# Patient Record
Sex: Female | Born: 1970 | Race: White | Hispanic: No | Marital: Single | Smoking: Current some day smoker
Health system: Southern US, Community
[De-identification: ages and names within clinical notes are randomized; demographics above are authoritative.]

## PROBLEM LIST (undated history)

## (undated) DIAGNOSIS — G40909 Epilepsy, unspecified, not intractable, without status epilepticus: Secondary | ICD-10-CM

## (undated) DIAGNOSIS — G629 Polyneuropathy, unspecified: Secondary | ICD-10-CM

## (undated) DIAGNOSIS — F329 Major depressive disorder, single episode, unspecified: Secondary | ICD-10-CM

## (undated) DIAGNOSIS — F32A Depression, unspecified: Secondary | ICD-10-CM

## (undated) DIAGNOSIS — F419 Anxiety disorder, unspecified: Secondary | ICD-10-CM

## (undated) DIAGNOSIS — S0990XA Unspecified injury of head, initial encounter: Secondary | ICD-10-CM

## (undated) DIAGNOSIS — K219 Gastro-esophageal reflux disease without esophagitis: Secondary | ICD-10-CM

## (undated) DIAGNOSIS — F70 Mild intellectual disabilities: Secondary | ICD-10-CM

## (undated) DIAGNOSIS — F609 Personality disorder, unspecified: Secondary | ICD-10-CM

## (undated) HISTORY — PX: TUBAL LIGATION: SHX77

## (undated) HISTORY — PX: OTHER SURGICAL HISTORY: SHX169

---

## 1992-04-28 HISTORY — PX: OTHER SURGICAL HISTORY: SHX169

## 1993-02-26 HISTORY — PX: TRACHEOSTOMY: SUR1362

## 2006-02-12 ENCOUNTER — Encounter: Admission: RE | Admit: 2006-02-12 | Discharge: 2006-02-12 | Payer: Self-pay | Admitting: Nurse Practitioner

## 2007-01-29 ENCOUNTER — Encounter: Admission: RE | Admit: 2007-01-29 | Discharge: 2007-03-16 | Payer: Self-pay | Admitting: Internal Medicine

## 2007-12-17 ENCOUNTER — Emergency Department (HOSPITAL_COMMUNITY): Admission: EM | Admit: 2007-12-17 | Discharge: 2007-12-17 | Payer: Self-pay | Admitting: Emergency Medicine

## 2008-06-04 ENCOUNTER — Emergency Department (HOSPITAL_COMMUNITY): Admission: EM | Admit: 2008-06-04 | Discharge: 2008-06-04 | Payer: Self-pay | Admitting: Emergency Medicine

## 2009-03-28 ENCOUNTER — Encounter: Admission: RE | Admit: 2009-03-28 | Discharge: 2009-05-10 | Payer: Self-pay | Admitting: Internal Medicine

## 2009-05-09 ENCOUNTER — Encounter: Admission: RE | Admit: 2009-05-09 | Discharge: 2009-05-31 | Payer: Self-pay | Admitting: Internal Medicine

## 2009-12-23 ENCOUNTER — Emergency Department (HOSPITAL_COMMUNITY): Admission: EM | Admit: 2009-12-23 | Discharge: 2009-12-23 | Payer: Self-pay | Admitting: Family Medicine

## 2010-05-27 ENCOUNTER — Emergency Department (HOSPITAL_COMMUNITY)
Admission: EM | Admit: 2010-05-27 | Discharge: 2010-05-27 | Payer: Self-pay | Source: Home / Self Care | Admitting: Emergency Medicine

## 2011-09-01 ENCOUNTER — Encounter (HOSPITAL_COMMUNITY): Payer: Self-pay | Admitting: Gastroenterology

## 2011-09-02 ENCOUNTER — Encounter (HOSPITAL_COMMUNITY): Payer: Self-pay | Admitting: *Deleted

## 2011-09-02 ENCOUNTER — Ambulatory Visit (HOSPITAL_COMMUNITY): Payer: Medicare Other | Admitting: Anesthesiology

## 2011-09-02 ENCOUNTER — Encounter (HOSPITAL_COMMUNITY): Payer: Self-pay | Admitting: Anesthesiology

## 2011-09-02 ENCOUNTER — Ambulatory Visit (HOSPITAL_COMMUNITY)
Admission: RE | Admit: 2011-09-02 | Discharge: 2011-09-02 | Disposition: A | Discharge status: Home / Self Care | Payer: Medicare Other | Source: Ambulatory Visit | Attending: Gastroenterology | Admitting: Gastroenterology

## 2011-09-02 ENCOUNTER — Encounter (HOSPITAL_COMMUNITY)
Admission: RE | Disposition: A | Discharge status: Home / Self Care | Payer: Self-pay | Source: Ambulatory Visit | Attending: Gastroenterology

## 2011-09-02 DIAGNOSIS — R198 Other specified symptoms and signs involving the digestive system and abdomen: Secondary | ICD-10-CM | POA: Insufficient documentation

## 2011-09-02 DIAGNOSIS — K648 Other hemorrhoids: Secondary | ICD-10-CM | POA: Insufficient documentation

## 2011-09-02 DIAGNOSIS — D126 Benign neoplasm of colon, unspecified: Secondary | ICD-10-CM | POA: Insufficient documentation

## 2011-09-02 HISTORY — DX: Anxiety disorder, unspecified: F41.9

## 2011-09-02 HISTORY — DX: Polyneuropathy, unspecified: G62.9

## 2011-09-02 HISTORY — DX: Mild intellectual disabilities: F70

## 2011-09-02 HISTORY — DX: Epilepsy, unspecified, not intractable, without status epilepticus: G40.909

## 2011-09-02 HISTORY — DX: Gastro-esophageal reflux disease without esophagitis: K21.9

## 2011-09-02 HISTORY — DX: Unspecified injury of head, initial encounter: S09.90XA

## 2011-09-02 HISTORY — DX: Major depressive disorder, single episode, unspecified: F32.9

## 2011-09-02 HISTORY — DX: Depression, unspecified: F32.A

## 2011-09-02 HISTORY — DX: Personality disorder, unspecified: F60.9

## 2011-09-02 SURGERY — COLONOSCOPY WITH PROPOFOL
Anesthesia: Monitor Anesthesia Care

## 2011-09-02 MED ORDER — LIDOCAINE HCL (CARDIAC) 20 MG/ML IV SOLN
INTRAVENOUS | Status: DC | PRN
  Administered 2011-09-02: 50 mg via INTRAVENOUS

## 2011-09-02 MED ORDER — ONDANSETRON HCL 4 MG/2ML IJ SOLN
INTRAMUSCULAR | Status: DC | PRN
  Administered 2011-09-02: 4 mg via INTRAVENOUS

## 2011-09-02 MED ORDER — FENTANYL CITRATE 0.05 MG/ML IJ SOLN: 25.0000 ug | INTRAMUSCULAR | Status: DC | PRN

## 2011-09-02 MED ORDER — GLYCOPYRROLATE 0.2 MG/ML IJ SOLN
INTRAMUSCULAR | Status: DC | PRN
  Administered 2011-09-02 (×3): .05 mg via INTRAVENOUS

## 2011-09-02 MED ORDER — KETAMINE HCL 50 MG/ML IJ SOLN
INTRAMUSCULAR | Status: DC | PRN
  Administered 2011-09-02 (×2): 10 mg via INTRAMUSCULAR
  Administered 2011-09-02 (×2): 5 mg via INTRAMUSCULAR

## 2011-09-02 MED ORDER — FENTANYL CITRATE 0.05 MG/ML IJ SOLN
INTRAMUSCULAR | Status: DC | PRN
  Administered 2011-09-02: 50 ug via INTRAVENOUS
  Administered 2011-09-02: 25 ug via INTRAVENOUS

## 2011-09-02 MED ORDER — LACTATED RINGERS IV SOLN
INTRAVENOUS | Status: DC | PRN
  Administered 2011-09-02: 09:00:00 via INTRAVENOUS

## 2011-09-02 MED ORDER — LACTATED RINGERS IV SOLN
INTRAVENOUS | Status: DC
  Administered 2011-09-02: 1000 mL via INTRAVENOUS

## 2011-09-02 MED ORDER — PROPOFOL 10 MG/ML IV EMUL
INTRAVENOUS | Status: DC | PRN
  Administered 2011-09-02: 200 ug/kg/min via INTRAVENOUS

## 2011-09-02 SURGICAL SUPPLY — 21 items

## 2011-09-02 NOTE — H&P (Signed)
  Date of Initial H&P: 08/07/11  History reviewed, patient examined, no change in status, stable for surgery.

## 2011-09-02 NOTE — Discharge Instructions (Addendum)
Do not take aspirin products for the next 7 days. Will call you when polyp results are available.  Colonoscopy Care After Read the instructions outlined below and refer to this sheet in the next few weeks. These discharge instructions provide you with general information on caring for yourself after you leave the hospital. Your doctor may also give you specific instructions. While your treatment has been planned according to the most current medical practices available, unavoidable complications occasionally occur. If you have any problems or questions after discharge, call your doctor. HOME CARE INSTRUCTIONS ACTIVITY:  You may resume your regular activity, but move at a slower pace for the next 24 hours.   Take frequent rest periods for the next 24 hours.   Walking will help get rid of the air and reduce the bloated feeling in your belly (abdomen).   No driving for 24 hours (because of the medicine (anesthesia) used during the test).   You may shower.   Do not sign any important legal documents or operate any machinery for 24 hours (because of the anesthesia used during the test).  NUTRITION:  Drink plenty of fluids.   You may resume your normal diet as instructed by your doctor.   Begin with a light meal and progress to your normal diet. Heavy or fried foods are harder to digest and may make you feel sick to your stomach (nauseated).   Avoid alcoholic beverages for 24 hours or as instructed.  MEDICATIONS:  You may resume your normal medications unless your doctor tells you otherwise.  WHAT TO EXPECT TODAY:  Some feelings of bloating in the abdomen.   Passage of more gas than usual.   Spotting of blood in your stool or on the toilet paper.  IF YOU HAD POLYPS REMOVED DURING THE COLONOSCOPY:  No aspirin products for 7 days or as instructed.   No alcohol for 7 days or as instructed.   Eat a soft diet for the next 24 hours.  FINDING OUT THE RESULTS OF YOUR TEST Not all test  results are available during your visit. If your test results are not back during the visit, make an appointment with your caregiver to find out the results. Do not assume everything is normal if you have not heard from your caregiver or the medical facility. It is important for you to follow up on all of your test results.  SEEK IMMEDIATE MEDICAL CARE IF:  You have more than a spotting of blood in your stool.   Your belly is swollen (abdominal distention).   You are nauseated or vomiting.   You have a fever.   You have abdominal pain or discomfort that is severe or gets worse throughout the day.    Moderate Sedation, Adult Moderate sedation is given to help you relax or even sleep through a procedure. You may remain sleepy, be clumsy, or have poor balance for several hours following this procedure. Arrange for a responsible adult, family member, or friend to take you home. A responsible adult should stay with you for at least 24 hours or until the medicines have worn off.  Do not participate in any activities where you could become injured for the next 24 hours, or until you feel normal again. Do not:   Drive.   Swim.   Ride a bicycle.   Operate heavy machinery.   Cook.   Use power tools.   Climb ladders.   Work at [employer].   Do not make important decisions  or sign legal documents until you are improved.   Vomiting may occur if you eat too soon. When you can drink without vomiting, try water, juice, or soup. Try solid foods if you feel little or no nausea.   Only take over-the-counter or prescription medications for pain, discomfort, or fever as directed by your caregiver.If pain medications have been prescribed for you, ask your caregiver how soon it is safe to take them.   Make sure you and your family fully understands everything about the medication given to you. Make sure you understand what side effects may occur.   You should not drink alcohol, take sleeping pills,  or medications that cause drowsiness for at least 24 hours.   If you smoke, do not smoke alone.   If you are feeling better, you may resume normal activities 24 hours after receiving sedation.   Keep all appointments as scheduled. Follow all instructions.   Ask questions if you do not understand.  SEEK MEDICAL CARE IF:   Your skin is pale or bluish in color.   You continue to feel sick to your stomach (nauseous) or throw up (vomit).   Your pain is getting worse and not helped by medication.   You have bleeding or swelling.   You are still sleepy or feeling clumsy after 24 hours.  SEEK IMMEDIATE MEDICAL CARE IF:   You develop a rash.   You have difficulty breathing.   You develop any type of allergic problem.   You have a fever.  Document Released: 01/07/2001 Document Revised: 04/03/2011 Document Reviewed: 05/31/2007 [facility] Patient Information 2012 [place], [place].

## 2011-09-02 NOTE — Preoperative (Signed)
Beta Blockers   Reason not to administer Beta Blockers:Not Applicable 

## 2011-09-02 NOTE — Op Note (Signed)
[facility] [address] [place], [place]  [address]  COLONOSCOPY PROCEDURE REPORT  PATIENT:  Angela Combs, Angela Combs  MR#:  [id number] BIRTHDATE:  08/26/1970, 41 yrs. old  GENDER:  female ENDOSCOPIST:  Charlott Rakes, MD REF. BY:  Fleet Contras, M.D. PROCEDURE DATE:  09/02/2011 PROCEDURE:  Colonoscopy with snare polypectomy ASA CLASS:  Class III INDICATIONS:  change in bowel habits MEDICATIONS:   See Anesthesia Report.  DESCRIPTION OF PROCEDURE:   After the risks benefits and alternatives of the procedure were thoroughly explained, informed consent was obtained.  The Pentax Ped Colon [id number] endoscope was introduced through the anus and advanced to the cecum, which was identified by both the appendix and ileocecal valve, without limitations.  The quality of the prep was good. The instrument was then slowly withdrawn as the colon was fully examined. <<PROCEDUREIMAGES>>  FINDINGS:  Rectal exam unremarkable.  Pediatric colonoscope inserted into the colon and advanced to the cecum, where the appendiceal orifice and ileocecal valve were identified.    On careful withdrawal of the colonoscope a 7 mm semi-sessile polyp was seen in the sigmoid colon and was removed with snare cautery. Retroflexion revealed small internal hemorrhoids.  COMPLICATIONS:  None  IMPRESSION:     1. Colon polyp - status post snare cautery 2. Small internal hemorrhoids  RECOMMENDATIONS:  1. F/U on path 2. High fiber diet 3. Hold aspirin products for 1 week  ______________________________ Charlott Rakes, MD  CC:  Fleet Contras, MD  n. Rosalie DoctorCharlott Rakes at 09/02/2011 10:16 AM  Azzie Roup, [phone]

## 2011-09-02 NOTE — Transfer of Care (Signed)
Immediate Anesthesia Transfer of Care Note  Patient: Angela Combs  Procedure(s) Performed: Procedure(s) (LRB): COLONOSCOPY WITH PROPOFOL (N/A)  Patient Location: PACU and Endoscopy Unit  Anesthesia Type: MAC  Level of Consciousness: awake, alert , oriented, patient cooperative and responds to stimulation  Airway & Oxygen Therapy: Patient Spontanous Breathing and Patient connected to nasal cannula oxygen  Post-op Assessment: Report given to PACU RN, Post -op Vital signs reviewed and stable and Patient moving all extremities  Post vital signs: Reviewed and stable  Complications: No apparent anesthesia complications

## 2011-09-02 NOTE — Interval H&P Note (Signed)
History and Physical Interval Note:  09/02/2011 9:33 AM  Angela Combs  has presented today for surgery, with the diagnosis of constipation/change in bowel  The various methods of treatment have been discussed with the patient and family. After consideration of risks, benefits and other options for treatment, the patient has consented to  Procedure(s) (LRB): COLONOSCOPY WITH PROPOFOL (N/A) as a surgical intervention .  The patients' history has been reviewed, patient examined, no change in status, stable for surgery.  I have reviewed the patients' chart and labs.  Questions were answered to the patient's satisfaction.     [name] C.

## 2011-09-02 NOTE — Brief Op Note (Signed)
See endopro note 

## 2011-09-02 NOTE — Anesthesia Postprocedure Evaluation (Signed)
  Anesthesia Post-op Note  Patient: Angela Combs  Procedure(s) Performed: Procedure(s) (LRB): COLONOSCOPY WITH PROPOFOL (N/A)  Patient Location: PACU  Anesthesia Type: MAC  Level of Consciousness: awake and alert   Airway and Oxygen Therapy: Patient Spontanous Breathing  Post-op Pain: mild  Post-op Assessment: Post-op Vital signs reviewed, Patient's Cardiovascular Status Stable, Respiratory Function Stable, Patent Airway and No signs of Nausea or vomiting  Post-op Vital Signs: stable  Complications: No apparent anesthesia complications
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
## 2012-12-08 ENCOUNTER — Encounter: Payer: Self-pay | Admitting: Advanced Practice Midwife

## 2013-01-11 ENCOUNTER — Ambulatory Visit: Payer: Self-pay | Admitting: Advanced Practice Midwife

## 2013-05-09 ENCOUNTER — Other Ambulatory Visit: Payer: Self-pay

## 2013-05-09 DIAGNOSIS — Z1231 Encounter for screening mammogram for malignant neoplasm of breast: Secondary | ICD-10-CM

## 2013-05-27 ENCOUNTER — Ambulatory Visit
Admission: RE | Admit: 2013-05-27 | Discharge: 2013-05-27 | Disposition: A | Discharge status: Home / Self Care | Payer: Medicare Other | Source: Ambulatory Visit

## 2013-05-27 DIAGNOSIS — Z1231 Encounter for screening mammogram for malignant neoplasm of breast: Secondary | ICD-10-CM

## 2014-01-09 ENCOUNTER — Ambulatory Visit: Payer: Medicare Other | Attending: Internal Medicine

## 2014-01-09 DIAGNOSIS — IMO0001 Reserved for inherently not codable concepts without codable children: Secondary | ICD-10-CM | POA: Insufficient documentation

## 2014-01-09 DIAGNOSIS — R279 Unspecified lack of coordination: Secondary | ICD-10-CM | POA: Insufficient documentation

## 2014-01-09 DIAGNOSIS — R262 Difficulty in walking, not elsewhere classified: Secondary | ICD-10-CM | POA: Insufficient documentation

## 2014-01-09 DIAGNOSIS — M25669 Stiffness of unspecified knee, not elsewhere classified: Secondary | ICD-10-CM | POA: Diagnosis not present

## 2014-01-11 ENCOUNTER — Ambulatory Visit: Payer: Medicare Other | Admitting: Physical Therapy

## 2014-01-16 ENCOUNTER — Ambulatory Visit: Payer: Medicare Other

## 2014-01-16 DIAGNOSIS — IMO0001 Reserved for inherently not codable concepts without codable children: Secondary | ICD-10-CM | POA: Diagnosis not present

## 2014-01-20 ENCOUNTER — Ambulatory Visit: Payer: Medicare Other

## 2014-01-20 DIAGNOSIS — IMO0001 Reserved for inherently not codable concepts without codable children: Secondary | ICD-10-CM | POA: Diagnosis not present

## 2014-01-25 ENCOUNTER — Ambulatory Visit: Payer: Medicare Other

## 2014-01-27 ENCOUNTER — Ambulatory Visit: Payer: Medicare Other | Admitting: Physical Therapy

## 2014-01-30 ENCOUNTER — Ambulatory Visit: Payer: Medicare Other | Attending: Internal Medicine

## 2014-01-30 DIAGNOSIS — R262 Difficulty in walking, not elsewhere classified: Secondary | ICD-10-CM | POA: Diagnosis not present

## 2014-01-30 DIAGNOSIS — Z87820 Personal history of traumatic brain injury: Secondary | ICD-10-CM | POA: Diagnosis not present

## 2014-01-30 DIAGNOSIS — M25669 Stiffness of unspecified knee, not elsewhere classified: Secondary | ICD-10-CM | POA: Diagnosis not present

## 2014-01-30 DIAGNOSIS — G8220 Paraplegia, unspecified: Secondary | ICD-10-CM | POA: Insufficient documentation

## 2014-01-30 DIAGNOSIS — R279 Unspecified lack of coordination: Secondary | ICD-10-CM | POA: Insufficient documentation

## 2014-01-30 DIAGNOSIS — R269 Unspecified abnormalities of gait and mobility: Secondary | ICD-10-CM | POA: Diagnosis not present

## 2014-02-01 ENCOUNTER — Ambulatory Visit: Payer: Medicare Other

## 2014-02-06 ENCOUNTER — Ambulatory Visit: Payer: Medicare Other

## 2014-02-06 DIAGNOSIS — R269 Unspecified abnormalities of gait and mobility: Secondary | ICD-10-CM | POA: Diagnosis not present

## 2014-02-08 ENCOUNTER — Ambulatory Visit: Payer: Medicare Other

## 2014-02-20 ENCOUNTER — Ambulatory Visit: Payer: Medicare Other

## 2014-02-22 ENCOUNTER — Ambulatory Visit: Payer: Medicare Other

## 2014-02-22 DIAGNOSIS — R269 Unspecified abnormalities of gait and mobility: Secondary | ICD-10-CM | POA: Diagnosis not present

## 2014-07-05 ENCOUNTER — Encounter: Payer: Self-pay | Admitting: Obstetrics & Gynecology

## 2014-07-08 ENCOUNTER — Encounter (HOSPITAL_COMMUNITY): Payer: Self-pay | Admitting: Emergency Medicine

## 2014-07-08 ENCOUNTER — Emergency Department (HOSPITAL_COMMUNITY)
Admission: EM | Admit: 2014-07-08 | Discharge: 2014-07-08 | Disposition: A | Discharge status: Home / Self Care | Payer: Medicare Other | Attending: Emergency Medicine | Admitting: Emergency Medicine

## 2014-07-08 DIAGNOSIS — Z7952 Long term (current) use of systemic steroids: Secondary | ICD-10-CM | POA: Insufficient documentation

## 2014-07-08 DIAGNOSIS — Z79899 Other long term (current) drug therapy: Secondary | ICD-10-CM | POA: Insufficient documentation

## 2014-07-08 DIAGNOSIS — K219 Gastro-esophageal reflux disease without esophagitis: Secondary | ICD-10-CM | POA: Insufficient documentation

## 2014-07-08 DIAGNOSIS — Y9389 Activity, other specified: Secondary | ICD-10-CM | POA: Insufficient documentation

## 2014-07-08 DIAGNOSIS — F419 Anxiety disorder, unspecified: Secondary | ICD-10-CM | POA: Insufficient documentation

## 2014-07-08 DIAGNOSIS — Z720 Tobacco use: Secondary | ICD-10-CM | POA: Diagnosis not present

## 2014-07-08 DIAGNOSIS — Y998 Other external cause status: Secondary | ICD-10-CM | POA: Insufficient documentation

## 2014-07-08 DIAGNOSIS — Y9289 Other specified places as the place of occurrence of the external cause: Secondary | ICD-10-CM | POA: Diagnosis not present

## 2014-07-08 DIAGNOSIS — F329 Major depressive disorder, single episode, unspecified: Secondary | ICD-10-CM | POA: Diagnosis not present

## 2014-07-08 DIAGNOSIS — Z8669 Personal history of other diseases of the nervous system and sense organs: Secondary | ICD-10-CM | POA: Diagnosis not present

## 2014-07-08 DIAGNOSIS — K5900 Constipation, unspecified: Secondary | ICD-10-CM | POA: Diagnosis not present

## 2014-07-08 DIAGNOSIS — Z3202 Encounter for pregnancy test, result negative: Secondary | ICD-10-CM | POA: Insufficient documentation

## 2014-07-08 DIAGNOSIS — X58XXXA Exposure to other specified factors, initial encounter: Secondary | ICD-10-CM | POA: Diagnosis not present

## 2014-07-08 DIAGNOSIS — T192XXA Foreign body in vulva and vagina, initial encounter: Secondary | ICD-10-CM | POA: Diagnosis present

## 2014-07-08 DIAGNOSIS — Z9851 Tubal ligation status: Secondary | ICD-10-CM | POA: Insufficient documentation

## 2014-07-08 LAB — URINE MICROSCOPIC-ADD ON

## 2014-07-08 LAB — URINALYSIS, ROUTINE W REFLEX MICROSCOPIC
BILIRUBIN URINE: NEGATIVE
GLUCOSE, UA: NEGATIVE mg/dL
Ketones, ur: NEGATIVE mg/dL
Nitrite: NEGATIVE
PH: 5.5 (ref 5.0–8.0)
Protein, ur: 30 mg/dL — AB
SPECIFIC GRAVITY, URINE: 1.021 (ref 1.005–1.030)
Urobilinogen, UA: 0.2 mg/dL (ref 0.0–1.0)

## 2014-07-08 LAB — WET PREP, GENITAL
Clue Cells Wet Prep HPF POC: NONE SEEN
TRICH WET PREP: NONE SEEN
YEAST WET PREP: NONE SEEN

## 2014-07-08 LAB — POC URINE PREG, ED: Preg Test, Ur: NEGATIVE
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
## 2014-07-08 NOTE — ED Notes (Addendum)
Stirrups set up. Pelvic cart at bedside.

## 2014-07-08 NOTE — Discharge Instructions (Signed)
I recommended she avoid tampons during this menstrual cycle and again in the future to prevent this from happening again. I recommended she use pads for your periods. I also recommend that you avoid sexual intercourse for the next several weeks. I recommend that you use condoms during every sexual encounter including with vaginal and anal intercourse. We have checked you for STDs. These tests are pending and will take at least a week to result. If you develop fever, abdominal pain, vomiting and cannot stop, a rash that looks like a sunburn, peeling skin, please return to the hospital.   Vaginal Foreign Body A vaginal foreign body is any object that gets stuck or left inside the vagina. Vaginal foreign bodies left in the vagina for a long time can cause irritation and infection. In most cases, symptoms go away once the vaginal foreign body is found and removed. Rarely, a foreign object can break through the walls of the vagina and cause a serious infection inside the abdomen.  CAUSES  The most common vaginal foreign bodies are:  Tampons.  Contraceptive devices.  Toilet tissue left in the vagina.  Small objects that were placed in the vagina out of curiosity and got stuck.  A result of sexual abuse. SIGNS AND SYMPTOMS  Light vaginal bleeding.  Blood-tinged vaginal fluid (discharge).  Vaginal discharge that smells bad.  Vaginal itching or burning.  Redness, swelling, or rash near the opening of the vagina.  Abdominal pain.  Fever.  Burning or frequent urination. DIAGNOSIS  Your health care provider may be able to diagnose a vaginal foreign body based on the information you provide, your symptoms, and a physical exam. Your health care provider may also perform the following tests to check for infection:  A swab of the discharge to check under a microscope for bacteria (culture).  A urine culture.  An examination of the vagina with a small, lighted scope (vaginoscopy).  Imaging  tests to get a picture of the inside of your vagina, such as:  Ultrasound.  X-ray.  MRI. TREATMENT  In most cases, a vaginal foreign body can be easily removed and the symptoms usually go away very quickly. Other treatment may include:   If the vaginal foreign body is not easily removed, medicine may be given to make you go to sleep (general anesthesia) to have the object removed.  Emergency surgery may be necessary if an infection spreads through the walls of the vagina into the abdomen (acute abdomen). This is rare.  You may need to take antibiotic medicine if you have a vaginal or urinary tract infection. HOME CARE INSTRUCTIONS   Take medicines only as directed by your health care provider.  If you were prescribed an antibiotic medicine, finish it all even if you start to feel better.  Do not have sex or use tampons until your health care provider says it is okay.  Do not douche or use vaginal rinses unless your health care provider recommends it.  Keep all follow-up visits as directed by your health care provider. This is important. SEEK MEDICAL CARE IF:  You have abdominal pain or burning pain when urinating.  You have a fever. SEEK IMMEDIATE MEDICAL CARE IF:  You have heavy vaginal bleeding or discharge.   You have very bad abdominal pain.  MAKE SURE YOU:  Understand these instructions.  Will watch your condition.  Will get help right away if you are not doing well or get worse. Document Released: 08/29/2013 Document Reviewed: 02/11/2013 [employer]  Patient Information 2015 [place]. This information is not intended to replace advice given to you by your health care provider. Make sure you discuss any questions you have with your health care provider.

## 2014-07-08 NOTE — ED Notes (Signed)
Pt states that she has a tampon stuck inside her since yesterday at 1500.  States that she went to remove a tampon that was already inside and could not find a string or the tampon so she stuck another tampon in which pushed the original tampon up higher.  Took the second tampon out so now there is just one to be removed.

## 2014-07-08 NOTE — ED Notes (Signed)
She is here with a caregiver who states pt. Has retained tampon.  Pt. Is minimally verbal; which is her baseline.  As I write this, the physician has just performed pelvic exam and reports obtaining retained tampon.

## 2014-07-09 LAB — HIV ANTIBODY (ROUTINE TESTING W REFLEX): HIV Screen 4th Generation wRfx: NONREACTIVE

## 2014-07-09 LAB — RPR: RPR Ser Ql: NONREACTIVE

## 2014-07-10 LAB — GC/CHLAMYDIA PROBE AMP (~~LOC~~) NOT AT ARMC
CHLAMYDIA, DNA PROBE: NEGATIVE
NEISSERIA GONORRHEA: NEGATIVE

## 2014-07-11 LAB — URINE CULTURE

## 2014-07-12 ENCOUNTER — Telehealth (HOSPITAL_BASED_OUTPATIENT_CLINIC_OR_DEPARTMENT_OTHER): Payer: Self-pay | Admitting: Emergency Medicine

## 2014-07-12 NOTE — Telephone Encounter (Signed)
Post ED Visit - Positive Culture Follow-up: Successful Patient Follow-Up  Culture assessed and recommendations reviewed by: []  Wes [place], Pharm.D., BCPS []  Heide Guile, Pharm.D., BCPS [x]  Alycia Rossetti, Pharm.D., BCPS []  [place], Pharm.D., BCPS, AAHIVP []  Legrand Como, Pharm.D., BCPS, AAHIVP []  Hassie Bruce, Pharm.D. []  Cassie Nicole Kindred, [place].D.  Positive urine culture Proteus  [x]  Patient discharged without antimicrobial prescription and treatment is now indicated []  Organism is resistant to prescribed ED discharge antimicrobial []  Patient with positive blood cultures  Changes discussed with ED provider: Clayton Bibles PA New antibiotic prescription Bactrim DS 1 tab bid x 3 days Called to [place] @ [phone]  Contacted patient, 07/12/14 1457   Angela Combs 07/12/2014, 2:59 PM

## 2014-07-12 NOTE — Progress Notes (Signed)
ED Antimicrobial Stewardship Positive Culture Follow Up   Angela Combs is an 44 y.o. female who presented to [facility] on 07/08/2014 with a chief complaint of  Chief Complaint  Patient presents with  . Foreign Body in Vagina    Recent Results (from the past 720 hour(s))  Wet prep, genital     Status: Abnormal   Collection Time: 07/08/14  9:05 AM  Result Value Ref Range Status   Yeast Wet Prep HPF POC NONE SEEN NONE SEEN Final   Trich, Wet Prep NONE SEEN NONE SEEN Final   Clue Cells Wet Prep HPF POC NONE SEEN NONE SEEN Final   WBC, Wet Prep HPF POC FEW (A) NONE SEEN Final  Urine culture     Status: None   Collection Time: 07/08/14  9:50 AM  Result Value Ref Range Status   Specimen Description URINE, RANDOM  Final   Special Requests NONE  Final   Colony Count   Final    >=100,000 COLONIES/ML Performed at [facility]   Final    PROTEUS MIRABILIS Performed at [facility]    Report Status 07/11/2014 FINAL  Final   Organism ID, Bacteria PROTEUS MIRABILIS  Final      Susceptibility   Proteus mirabilis - MIC*    AMPICILLIN <=2 SENSITIVE Sensitive     CEFAZOLIN <=4 SENSITIVE Sensitive     CEFTRIAXONE <=1 SENSITIVE Sensitive     CIPROFLOXACIN <=0.25 SENSITIVE Sensitive     GENTAMICIN <=1 SENSITIVE Sensitive     LEVOFLOXACIN <=0.12 SENSITIVE Sensitive     NITROFURANTOIN 256 RESISTANT Resistant     TOBRAMYCIN <=1 SENSITIVE Sensitive     TRIMETH/SULFA <=20 SENSITIVE Sensitive     PIP/TAZO <=4 SENSITIVE Sensitive     * PROTEUS MIRABILIS    [x]  Patient discharged originally without antimicrobial agent and treatment is now indicated  New antibiotic prescription: Bactrim DS 1 tab bid x 3 days  ED Provider: Clayton Bibles, PA-C   [name] 07/12/2014, 12:09 PM Infectious Diseases Pharmacist Phone# [phone]

## 2014-07-25 ENCOUNTER — Encounter: Payer: Self-pay | Admitting: *Deleted

## 2014-08-16 ENCOUNTER — Encounter: Payer: Medicare Other | Admitting: Obstetrics & Gynecology

## 2014-11-22 ENCOUNTER — Encounter: Payer: Medicare Other | Admitting: Obstetrics & Gynecology

## 2016-03-23 ENCOUNTER — Encounter (HOSPITAL_COMMUNITY): Payer: Self-pay | Admitting: Emergency Medicine

## 2016-03-23 ENCOUNTER — Emergency Department (HOSPITAL_COMMUNITY): Payer: Medicare Other

## 2016-03-23 ENCOUNTER — Emergency Department (HOSPITAL_COMMUNITY)
Admission: EM | Admit: 2016-03-23 | Discharge: 2016-03-23 | Disposition: A | Discharge status: Home / Self Care | Payer: Medicare Other | Attending: Emergency Medicine | Admitting: Emergency Medicine

## 2016-03-23 DIAGNOSIS — R0789 Other chest pain: Secondary | ICD-10-CM

## 2016-03-23 DIAGNOSIS — Y92410 Unspecified street and highway as the place of occurrence of the external cause: Secondary | ICD-10-CM | POA: Insufficient documentation

## 2016-03-23 DIAGNOSIS — F17210 Nicotine dependence, cigarettes, uncomplicated: Secondary | ICD-10-CM | POA: Insufficient documentation

## 2016-03-23 DIAGNOSIS — Y939 Activity, unspecified: Secondary | ICD-10-CM | POA: Diagnosis not present

## 2016-03-23 DIAGNOSIS — S299XXA Unspecified injury of thorax, initial encounter: Secondary | ICD-10-CM | POA: Diagnosis present

## 2016-03-23 DIAGNOSIS — S20311A Abrasion of right front wall of thorax, initial encounter: Secondary | ICD-10-CM | POA: Diagnosis not present

## 2016-03-23 DIAGNOSIS — Y999 Unspecified external cause status: Secondary | ICD-10-CM | POA: Insufficient documentation
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
## 2016-03-23 NOTE — ED Notes (Signed)
Bed: WA20 Expected date:  Expected time:  Means of arrival:  Comments: 45 yo chest pain
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
## 2016-03-23 NOTE — ED Notes (Signed)
Pt wheeled out.  Ambulatory and independent at discharge.  Verbalized understanding of discharge instructions.

## 2016-06-05 ENCOUNTER — Other Ambulatory Visit: Payer: Self-pay | Admitting: Internal Medicine

## 2016-06-05 DIAGNOSIS — Z1231 Encounter for screening mammogram for malignant neoplasm of breast: Secondary | ICD-10-CM

## 2016-06-19 ENCOUNTER — Ambulatory Visit
Admission: RE | Admit: 2016-06-19 | Discharge: 2016-06-19 | Disposition: A | Discharge status: Home / Self Care | Payer: Medicare Other | Source: Ambulatory Visit | Attending: Internal Medicine | Admitting: Internal Medicine

## 2016-06-19 DIAGNOSIS — Z1231 Encounter for screening mammogram for malignant neoplasm of breast: Secondary | ICD-10-CM

## 2016-06-23 ENCOUNTER — Other Ambulatory Visit: Payer: Self-pay | Admitting: Internal Medicine

## 2016-06-23 DIAGNOSIS — R928 Other abnormal and inconclusive findings on diagnostic imaging of breast: Secondary | ICD-10-CM

## 2016-07-03 ENCOUNTER — Other Ambulatory Visit: Payer: Medicare Other

## 2016-07-10 ENCOUNTER — Ambulatory Visit: Payer: Medicare Other | Attending: Internal Medicine | Admitting: Occupational Therapy

## 2016-07-10 DIAGNOSIS — G8111 Spastic hemiplegia affecting right dominant side: Secondary | ICD-10-CM | POA: Diagnosis present
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
## 2016-07-11 ENCOUNTER — Ambulatory Visit
Admission: RE | Admit: 2016-07-11 | Discharge: 2016-07-11 | Disposition: A | Discharge status: Home / Self Care | Payer: Medicare Other | Source: Ambulatory Visit | Attending: Internal Medicine | Admitting: Internal Medicine

## 2016-07-11 DIAGNOSIS — R928 Other abnormal and inconclusive findings on diagnostic imaging of breast: Secondary | ICD-10-CM

## 2016-07-31 ENCOUNTER — Ambulatory Visit: Payer: Medicare Other | Admitting: Occupational Therapy

## 2016-08-07 ENCOUNTER — Ambulatory Visit: Payer: Medicare Other | Attending: Internal Medicine | Admitting: Occupational Therapy

## 2016-08-07 DIAGNOSIS — G8111 Spastic hemiplegia affecting right dominant side: Secondary | ICD-10-CM | POA: Insufficient documentation
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
## 2016-08-20 ENCOUNTER — Encounter (HOSPITAL_COMMUNITY): Payer: Self-pay | Admitting: Emergency Medicine

## 2016-08-20 ENCOUNTER — Ambulatory Visit (HOSPITAL_COMMUNITY)
Admission: EM | Admit: 2016-08-20 | Discharge: 2016-08-20 | Disposition: A | Discharge status: Home / Self Care | Payer: Medicare Other | Attending: Family Medicine | Admitting: Family Medicine

## 2016-08-20 DIAGNOSIS — J302 Other seasonal allergic rhinitis: Secondary | ICD-10-CM

## 2016-08-20 MED ORDER — CETIRIZINE HCL 10 MG PO CAPS: 10.0000 mg | ORAL_CAPSULE | Freq: Every day | ORAL | 2 refills | Status: DC

## 2016-08-20 MED ORDER — FLUTICASONE PROPIONATE 50 MCG/ACT NA SUSP: 2.0000 | Freq: Every day | NASAL | 2 refills | Status: DC

## 2016-08-20 NOTE — Discharge Instructions (Signed)
We are treating you today for allergic rhinitis. I have prescribed Zyrtec, take one tablet every day throughout the remainder the allergy season, I also recommend taking Flonase nasal spray 2 sprays each nostril every day for the remainder of the allergy season. If your symptoms persist, or fail to resolve, follow up with her primary care provider, return to clinic as needed.

## 2016-08-20 NOTE — ED Triage Notes (Signed)
The patient presented to the [facility] with a complaint of allergy symptoms and a cough x 1 week.
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
## 2016-08-28 DIAGNOSIS — J302 Other seasonal allergic rhinitis: Secondary | ICD-10-CM | POA: Insufficient documentation

## 2016-08-28 DIAGNOSIS — G629 Polyneuropathy, unspecified: Secondary | ICD-10-CM | POA: Insufficient documentation

## 2016-08-28 DIAGNOSIS — F5104 Psychophysiologic insomnia: Secondary | ICD-10-CM | POA: Insufficient documentation

## 2016-08-28 DIAGNOSIS — Z87820 Personal history of traumatic brain injury: Secondary | ICD-10-CM | POA: Insufficient documentation

## 2016-08-28 DIAGNOSIS — G8101 Flaccid hemiplegia affecting right dominant side: Secondary | ICD-10-CM | POA: Insufficient documentation

## 2016-08-28 DIAGNOSIS — K5909 Other constipation: Secondary | ICD-10-CM | POA: Insufficient documentation

## 2016-12-04 DIAGNOSIS — R479 Unspecified speech disturbances: Secondary | ICD-10-CM | POA: Insufficient documentation

## 2016-12-23 ENCOUNTER — Ambulatory Visit (HOSPITAL_COMMUNITY)
Admission: EM | Admit: 2016-12-23 | Discharge: 2016-12-23 | Disposition: A | Discharge status: Home / Self Care | Payer: Medicare Other | Attending: Emergency Medicine | Admitting: Emergency Medicine

## 2016-12-23 ENCOUNTER — Encounter (HOSPITAL_COMMUNITY): Payer: Self-pay | Admitting: Family Medicine

## 2016-12-23 DIAGNOSIS — J309 Allergic rhinitis, unspecified: Secondary | ICD-10-CM

## 2016-12-23 MED ORDER — PREDNISONE 10 MG (21) PO TBPK: ORAL_TABLET | ORAL | 0 refills | Status: DC

## 2016-12-23 MED ORDER — IPRATROPIUM BROMIDE 0.06 % NA SOLN: 2.0000 | Freq: Four times a day (QID) | NASAL | 0 refills | Status: DC

## 2016-12-23 MED ORDER — AMOXICILLIN-POT CLAVULANATE 875-125 MG PO TABS: 1.0000 | ORAL_TABLET | Freq: Two times a day (BID) | ORAL | 0 refills | Status: DC

## 2016-12-23 MED ORDER — LORATADINE-PSEUDOEPHEDRINE ER 5-120 MG PO TB12: 1.0000 | ORAL_TABLET | Freq: Two times a day (BID) | ORAL | 0 refills | Status: DC

## 2016-12-23 NOTE — ED Triage Notes (Signed)
Pt here for allergies and she thinks possible sinusitis.

## 2016-12-23 NOTE — Discharge Instructions (Signed)
Take the medication as written. Return if you get worse, have a fever >100.4, or for any concerns. You may take 600 mg of motrin with 1 gram of tylenol up to 3-4 times a day as needed for pain. This is an effective combination for pain.  Most sinus infections are viral and do not need antibiotics unless you have a high fever, have had this for 10 days, or you get better and then get sick again. Use a neti pot or the NeilMed sinus rinse as often as you want to to reduce nasal congestion. Follow the directions on the box.   Go to www.goodrx.com to look up your medications. This will give you a list of where you can find your prescriptions at the most affordable prices. Or you can ask the pharmacist what the cash price is. This is frequently cheaper than going through insurance.
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
## 2017-02-02 ENCOUNTER — Ambulatory Visit (HOSPITAL_COMMUNITY)
Admission: EM | Admit: 2017-02-02 | Discharge: 2017-02-02 | Disposition: A | Discharge status: Home / Self Care | Payer: Medicare Other | Attending: Family Medicine | Admitting: Family Medicine

## 2017-02-02 ENCOUNTER — Encounter (HOSPITAL_COMMUNITY): Payer: Self-pay | Admitting: Emergency Medicine

## 2017-02-02 DIAGNOSIS — K219 Gastro-esophageal reflux disease without esophagitis: Secondary | ICD-10-CM | POA: Insufficient documentation

## 2017-02-02 DIAGNOSIS — G629 Polyneuropathy, unspecified: Secondary | ICD-10-CM | POA: Insufficient documentation

## 2017-02-02 DIAGNOSIS — F419 Anxiety disorder, unspecified: Secondary | ICD-10-CM | POA: Diagnosis not present

## 2017-02-02 DIAGNOSIS — N3001 Acute cystitis with hematuria: Secondary | ICD-10-CM | POA: Diagnosis not present

## 2017-02-02 DIAGNOSIS — G40909 Epilepsy, unspecified, not intractable, without status epilepticus: Secondary | ICD-10-CM | POA: Diagnosis not present

## 2017-02-02 DIAGNOSIS — R300 Dysuria: Secondary | ICD-10-CM | POA: Diagnosis not present

## 2017-02-02 DIAGNOSIS — N3000 Acute cystitis without hematuria: Secondary | ICD-10-CM | POA: Diagnosis not present

## 2017-02-02 DIAGNOSIS — F329 Major depressive disorder, single episode, unspecified: Secondary | ICD-10-CM | POA: Insufficient documentation

## 2017-02-02 DIAGNOSIS — H66001 Acute suppurative otitis media without spontaneous rupture of ear drum, right ear: Secondary | ICD-10-CM | POA: Diagnosis not present

## 2017-02-02 DIAGNOSIS — F17210 Nicotine dependence, cigarettes, uncomplicated: Secondary | ICD-10-CM | POA: Insufficient documentation

## 2017-02-02 LAB — POCT URINALYSIS DIP (DEVICE)
Bilirubin Urine: NEGATIVE
Glucose, UA: NEGATIVE mg/dL
Hgb urine dipstick: NEGATIVE
KETONES UR: NEGATIVE mg/dL
Leukocytes, UA: NEGATIVE
Nitrite: POSITIVE — AB
PH: 5.5 (ref 5.0–8.0)
Protein, ur: NEGATIVE mg/dL
Specific Gravity, Urine: 1.02 (ref 1.005–1.030)
Urobilinogen, UA: 0.2 mg/dL (ref 0.0–1.0)

## 2017-02-02 MED ORDER — CEPHALEXIN 500 MG PO CAPS: 500.0000 mg | ORAL_CAPSULE | Freq: Two times a day (BID) | ORAL | 0 refills | Status: AC

## 2017-02-02 NOTE — ED Triage Notes (Addendum)
Removed "black wax " from right ear, using her fingernail.  Shortly after this a friend noticed blood from right ear.  Patient has had pain.  Muffled hearing in right ear and interfering with equilibrium too per patient  Patient says her urine smells bad.  3-4 days ago was onset.  No pain, but says he never has pain with uti.

## 2017-02-02 NOTE — ED Notes (Signed)
Placed urine in lab

## 2017-02-02 NOTE — ED Notes (Signed)
Copied caregivers form for scanning into medical records.  Discussed with melissa browning, ad the form.  Provider has left for the day.
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
## 2017-02-05 LAB — URINE CULTURE: Culture: >=100000 — AB

## 2017-06-04 ENCOUNTER — Other Ambulatory Visit: Payer: Self-pay

## 2017-06-04 DIAGNOSIS — Z1231 Encounter for screening mammogram for malignant neoplasm of breast: Secondary | ICD-10-CM

## 2017-06-25 ENCOUNTER — Other Ambulatory Visit: Payer: Self-pay | Admitting: Physician Assistant

## 2017-06-25 ENCOUNTER — Ambulatory Visit
Admission: RE | Admit: 2017-06-25 | Discharge: 2017-06-25 | Disposition: A | Discharge status: Home / Self Care | Payer: Medicare Other | Source: Ambulatory Visit | Attending: *Deleted | Admitting: *Deleted

## 2017-06-25 DIAGNOSIS — Z1231 Encounter for screening mammogram for malignant neoplasm of breast: Secondary | ICD-10-CM

## 2017-11-09 ENCOUNTER — Ambulatory Visit (HOSPITAL_COMMUNITY)
Admission: EM | Admit: 2017-11-09 | Discharge: 2017-11-09 | Disposition: A | Discharge status: Home / Self Care | Payer: Medicare Other | Attending: Family Medicine | Admitting: Family Medicine

## 2017-11-09 ENCOUNTER — Encounter (HOSPITAL_COMMUNITY): Payer: Self-pay | Admitting: Emergency Medicine

## 2017-11-09 DIAGNOSIS — F419 Anxiety disorder, unspecified: Secondary | ICD-10-CM | POA: Diagnosis not present

## 2017-11-09 DIAGNOSIS — Z888 Allergy status to other drugs, medicaments and biological substances status: Secondary | ICD-10-CM | POA: Insufficient documentation

## 2017-11-09 DIAGNOSIS — G629 Polyneuropathy, unspecified: Secondary | ICD-10-CM | POA: Diagnosis not present

## 2017-11-09 DIAGNOSIS — G40909 Epilepsy, unspecified, not intractable, without status epilepticus: Secondary | ICD-10-CM | POA: Insufficient documentation

## 2017-11-09 DIAGNOSIS — Z79899 Other long term (current) drug therapy: Secondary | ICD-10-CM | POA: Diagnosis not present

## 2017-11-09 DIAGNOSIS — K219 Gastro-esophageal reflux disease without esophagitis: Secondary | ICD-10-CM | POA: Diagnosis not present

## 2017-11-09 DIAGNOSIS — F17210 Nicotine dependence, cigarettes, uncomplicated: Secondary | ICD-10-CM | POA: Diagnosis not present

## 2017-11-09 DIAGNOSIS — F70 Mild intellectual disabilities: Secondary | ICD-10-CM | POA: Diagnosis not present

## 2017-11-09 DIAGNOSIS — Z3202 Encounter for pregnancy test, result negative: Secondary | ICD-10-CM

## 2017-11-09 DIAGNOSIS — R350 Frequency of micturition: Secondary | ICD-10-CM | POA: Diagnosis present

## 2017-11-09 DIAGNOSIS — Z113 Encounter for screening for infections with a predominantly sexual mode of transmission: Secondary | ICD-10-CM

## 2017-11-09 DIAGNOSIS — F329 Major depressive disorder, single episode, unspecified: Secondary | ICD-10-CM | POA: Diagnosis not present

## 2017-11-09 DIAGNOSIS — Z87440 Personal history of urinary (tract) infections: Secondary | ICD-10-CM | POA: Diagnosis not present

## 2017-11-09 LAB — POCT URINALYSIS DIP (DEVICE)
BILIRUBIN URINE: NEGATIVE
Glucose, UA: NEGATIVE mg/dL
LEUKOCYTES UA: NEGATIVE
Nitrite: NEGATIVE
Protein, ur: NEGATIVE mg/dL
SPECIFIC GRAVITY, URINE: 1.025 (ref 1.005–1.030)
Urobilinogen, UA: 0.2 mg/dL (ref 0.0–1.0)
pH: 5.5 (ref 5.0–8.0)

## 2017-11-09 LAB — POCT PREGNANCY, URINE: Preg Test, Ur: NEGATIVE

## 2017-11-09 NOTE — Discharge Instructions (Addendum)
It was nice meeting you!!  I am sending your urine for culture and to test for STDs.  We are not treating you right now based on your urine results.  We will call you with the results and treat as needed.
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
## 2017-11-09 NOTE — ED Triage Notes (Signed)
Pt states her urine smells and thinks she has a UTI, pt also requesting STD check.

## 2017-11-09 NOTE — ED Notes (Signed)
Patient has multiple questions on discharge.  Notified provider

## 2017-11-10 LAB — CERVICOVAGINAL ANCILLARY ONLY
BACTERIAL VAGINITIS: POSITIVE — AB
Candida vaginitis: NEGATIVE
Chlamydia: NEGATIVE
Neisseria Gonorrhea: NEGATIVE
Trichomonas: NEGATIVE

## 2017-11-10 LAB — URINE CULTURE

## 2017-11-12 ENCOUNTER — Telehealth (HOSPITAL_COMMUNITY): Payer: Self-pay

## 2017-11-12 MED ORDER — METRONIDAZOLE 500 MG PO TABS: 500.0000 mg | ORAL_TABLET | Freq: Two times a day (BID) | ORAL | 0 refills | Status: DC

## 2017-11-12 NOTE — Telephone Encounter (Signed)
Positive for Bacterial Vaginosis. Attempted to reach patient. No answer at this time. Rx for Flagyl 500 mg BID x 7 days sent to pharmacy of record.

## 2017-12-15 ENCOUNTER — Ambulatory Visit (HOSPITAL_COMMUNITY)
Admission: EM | Admit: 2017-12-15 | Discharge: 2017-12-15 | Disposition: A | Discharge status: Home / Self Care | Payer: Medicare Other | Attending: Family Medicine | Admitting: Family Medicine

## 2017-12-15 ENCOUNTER — Encounter (HOSPITAL_COMMUNITY): Payer: Self-pay | Admitting: Emergency Medicine

## 2017-12-15 DIAGNOSIS — T161XXA Foreign body in right ear, initial encounter: Secondary | ICD-10-CM | POA: Diagnosis not present

## 2017-12-15 DIAGNOSIS — H9201 Otalgia, right ear: Secondary | ICD-10-CM | POA: Diagnosis not present

## 2017-12-15 MED ORDER — NEOMYCIN-POLYMYXIN-HC 3.5-10000-1 OT SUSP: 4.0000 [drp] | Freq: Four times a day (QID) | OTIC | 0 refills | Status: DC

## 2017-12-15 NOTE — Discharge Instructions (Signed)
Use eardrops as prescribed Return for any problems
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
## 2017-12-15 NOTE — ED Triage Notes (Signed)
Pt here for right sided ear pain and things she has foreign body in ear

## 2018-02-26 ENCOUNTER — Encounter (HOSPITAL_COMMUNITY): Payer: Self-pay | Admitting: Emergency Medicine

## 2018-02-26 ENCOUNTER — Other Ambulatory Visit: Payer: Self-pay

## 2018-02-26 ENCOUNTER — Ambulatory Visit (HOSPITAL_COMMUNITY)
Admission: EM | Admit: 2018-02-26 | Discharge: 2018-02-26 | Disposition: A | Discharge status: Home / Self Care | Payer: Medicare Other | Attending: Family Medicine | Admitting: Family Medicine

## 2018-02-26 DIAGNOSIS — N898 Other specified noninflammatory disorders of vagina: Secondary | ICD-10-CM

## 2018-02-26 DIAGNOSIS — F17210 Nicotine dependence, cigarettes, uncomplicated: Secondary | ICD-10-CM | POA: Insufficient documentation

## 2018-02-26 DIAGNOSIS — G629 Polyneuropathy, unspecified: Secondary | ICD-10-CM | POA: Insufficient documentation

## 2018-02-26 DIAGNOSIS — F70 Mild intellectual disabilities: Secondary | ICD-10-CM | POA: Diagnosis not present

## 2018-02-26 DIAGNOSIS — F419 Anxiety disorder, unspecified: Secondary | ICD-10-CM | POA: Insufficient documentation

## 2018-02-26 DIAGNOSIS — Z888 Allergy status to other drugs, medicaments and biological substances status: Secondary | ICD-10-CM | POA: Insufficient documentation

## 2018-02-26 DIAGNOSIS — F329 Major depressive disorder, single episode, unspecified: Secondary | ICD-10-CM | POA: Diagnosis not present

## 2018-02-26 DIAGNOSIS — Z79899 Other long term (current) drug therapy: Secondary | ICD-10-CM | POA: Insufficient documentation

## 2018-02-26 DIAGNOSIS — Z791 Long term (current) use of non-steroidal anti-inflammatories (NSAID): Secondary | ICD-10-CM | POA: Diagnosis not present

## 2018-02-26 DIAGNOSIS — G40909 Epilepsy, unspecified, not intractable, without status epilepticus: Secondary | ICD-10-CM | POA: Insufficient documentation

## 2018-02-26 DIAGNOSIS — R350 Frequency of micturition: Secondary | ICD-10-CM | POA: Insufficient documentation

## 2018-02-26 DIAGNOSIS — K219 Gastro-esophageal reflux disease without esophagitis: Secondary | ICD-10-CM | POA: Insufficient documentation

## 2018-02-26 LAB — POCT URINALYSIS DIP (DEVICE)
Bilirubin Urine: NEGATIVE
Glucose, UA: NEGATIVE mg/dL
Ketones, ur: NEGATIVE mg/dL
Leukocytes, UA: NEGATIVE
Nitrite: NEGATIVE
PH: 7 (ref 5.0–8.0)
PROTEIN: NEGATIVE mg/dL
SPECIFIC GRAVITY, URINE: 1.02 (ref 1.005–1.030)
UROBILINOGEN UA: 0.2 mg/dL (ref 0.0–1.0)

## 2018-02-26 MED ORDER — METRONIDAZOLE 500 MG PO TABS: 500.0000 mg | ORAL_TABLET | Freq: Two times a day (BID) | ORAL | 0 refills | Status: AC
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
## 2018-02-26 NOTE — Discharge Instructions (Signed)
Your urine did not show signs of infection today Since she tested positive for BV in the past and were not treated for this we will go ahead and start treatment for this Please take metronidazole twice daily over the next week, do not drink alcohol until 24 hours after taking the last tablet  Please drink plenty of fluids Follow-up if symptoms not improving

## 2018-02-26 NOTE — ED Triage Notes (Signed)
Pt reports urinary frequency and clear vaginal discharge x2 days.  Pt states she gets recurrent UTI's.

## 2018-03-01 LAB — CERVICOVAGINAL ANCILLARY ONLY
Bacterial vaginitis: POSITIVE — AB
Candida vaginitis: NEGATIVE
Chlamydia: NEGATIVE
Neisseria Gonorrhea: NEGATIVE
TRICH (WINDOWPATH): NEGATIVE

## 2018-04-15 ENCOUNTER — Other Ambulatory Visit: Payer: Self-pay | Admitting: Physician Assistant

## 2018-04-15 DIAGNOSIS — Z1231 Encounter for screening mammogram for malignant neoplasm of breast: Secondary | ICD-10-CM

## 2018-04-22 ENCOUNTER — Other Ambulatory Visit: Payer: Self-pay | Admitting: Physician Assistant

## 2018-04-22 DIAGNOSIS — N611 Abscess of the breast and nipple: Secondary | ICD-10-CM

## 2018-05-06 ENCOUNTER — Ambulatory Visit
Admission: RE | Admit: 2018-05-06 | Discharge: 2018-05-06 | Disposition: A | Discharge status: Home / Self Care | Payer: Medicare Other | Source: Ambulatory Visit | Attending: Physician Assistant | Admitting: Physician Assistant

## 2018-05-06 DIAGNOSIS — N611 Abscess of the breast and nipple: Secondary | ICD-10-CM

## 2018-07-02 ENCOUNTER — Ambulatory Visit
Admission: RE | Admit: 2018-07-02 | Discharge: 2018-07-02 | Disposition: A | Discharge status: Home / Self Care | Payer: Medicare Other | Source: Ambulatory Visit | Attending: Physician Assistant | Admitting: Physician Assistant

## 2018-07-02 ENCOUNTER — Ambulatory Visit: Payer: Medicare Other

## 2018-07-02 DIAGNOSIS — Z1231 Encounter for screening mammogram for malignant neoplasm of breast: Secondary | ICD-10-CM

## 2018-07-06 ENCOUNTER — Other Ambulatory Visit: Payer: Self-pay | Admitting: Physician Assistant

## 2018-07-06 DIAGNOSIS — R928 Other abnormal and inconclusive findings on diagnostic imaging of breast: Secondary | ICD-10-CM

## 2018-07-15 ENCOUNTER — Other Ambulatory Visit: Payer: Medicare Other

## 2018-07-16 ENCOUNTER — Other Ambulatory Visit: Payer: Medicare Other

## 2018-08-05 ENCOUNTER — Other Ambulatory Visit: Payer: Medicare Other

## 2018-12-27 ENCOUNTER — Other Ambulatory Visit: Payer: Medicare Other

## 2018-12-30 ENCOUNTER — Other Ambulatory Visit: Payer: Medicare Other

## 2019-01-05 ENCOUNTER — Ambulatory Visit
Admission: RE | Admit: 2019-01-05 | Discharge: 2019-01-05 | Disposition: A | Discharge status: Home / Self Care | Payer: Medicare Other | Source: Ambulatory Visit | Attending: Physician Assistant | Admitting: Physician Assistant

## 2019-01-05 ENCOUNTER — Other Ambulatory Visit: Payer: Self-pay

## 2019-01-05 DIAGNOSIS — R928 Other abnormal and inconclusive findings on diagnostic imaging of breast: Secondary | ICD-10-CM

## 2019-02-22 ENCOUNTER — Other Ambulatory Visit: Payer: Self-pay

## 2019-02-22 DIAGNOSIS — Z20822 Contact with and (suspected) exposure to covid-19: Secondary | ICD-10-CM

## 2019-02-24 LAB — NOVEL CORONAVIRUS, NAA: SARS-CoV-2, NAA: NOT DETECTED

## 2019-03-03 ENCOUNTER — Telehealth: Payer: Self-pay | Admitting: *Deleted

## 2019-03-03 NOTE — Telephone Encounter (Signed)
Negative COVID results given. Patient results "NOT Detected." Caller expressed understanding. ° °

## 2019-05-25 DIAGNOSIS — Z720 Tobacco use: Secondary | ICD-10-CM | POA: Insufficient documentation

## 2019-09-06 ENCOUNTER — Encounter (HOSPITAL_COMMUNITY): Payer: Self-pay | Admitting: Orthopedic Surgery

## 2019-09-06 ENCOUNTER — Ambulatory Visit (HOSPITAL_COMMUNITY)
Admission: EM | Admit: 2019-09-06 | Discharge: 2019-09-06 | Disposition: A | Discharge status: Home / Self Care | Payer: Medicare Other | Attending: Emergency Medicine | Admitting: Emergency Medicine

## 2019-09-06 DIAGNOSIS — Z888 Allergy status to other drugs, medicaments and biological substances status: Secondary | ICD-10-CM | POA: Diagnosis not present

## 2019-09-06 DIAGNOSIS — J069 Acute upper respiratory infection, unspecified: Secondary | ICD-10-CM | POA: Diagnosis not present

## 2019-09-06 DIAGNOSIS — Z79899 Other long term (current) drug therapy: Secondary | ICD-10-CM | POA: Diagnosis not present

## 2019-09-06 DIAGNOSIS — F70 Mild intellectual disabilities: Secondary | ICD-10-CM | POA: Diagnosis not present

## 2019-09-06 DIAGNOSIS — G40909 Epilepsy, unspecified, not intractable, without status epilepticus: Secondary | ICD-10-CM | POA: Diagnosis not present

## 2019-09-06 DIAGNOSIS — F17210 Nicotine dependence, cigarettes, uncomplicated: Secondary | ICD-10-CM | POA: Diagnosis not present

## 2019-09-06 DIAGNOSIS — H60391 Other infective otitis externa, right ear: Secondary | ICD-10-CM | POA: Diagnosis not present

## 2019-09-06 DIAGNOSIS — Z20822 Contact with and (suspected) exposure to covid-19: Secondary | ICD-10-CM | POA: Insufficient documentation

## 2019-09-06 DIAGNOSIS — H9201 Otalgia, right ear: Secondary | ICD-10-CM | POA: Diagnosis present

## 2019-09-06 LAB — SARS CORONAVIRUS 2 (TAT 6-24 HRS): SARS Coronavirus 2: NEGATIVE

## 2019-09-06 MED ORDER — IBUPROFEN 600 MG PO TABS: 600.0000 mg | ORAL_TABLET | Freq: Four times a day (QID) | ORAL | 0 refills | Status: DC | PRN

## 2019-09-06 MED ORDER — NEOMYCIN-POLYMYXIN-HC 3.5-10000-1 OT SUSP: 4.0000 [drp] | Freq: Three times a day (TID) | OTIC | 0 refills | Status: AC

## 2019-09-06 NOTE — ED Triage Notes (Signed)
Pt reports right ear pain with draingae/runny nose and congestion, for 3 days. Body aches for 5 days.  Pt also report headache yesterday. Denies abdominal pain. NVD. No c/o of sore throat.
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
## 2019-09-06 NOTE — Discharge Instructions (Addendum)
Use the drops 3 times a day int he Right ear. Use up to 10 days, if symptoms completely resolve prior, use for 1 additional day and then stop  Ibuprofen may be given every 6 hours for pain   If no improvement in 3 days return or follow up with Primary care.   Please schedule follow up with Primary care for re-evaluation of ear in 7-10 days

## 2019-11-15 ENCOUNTER — Encounter (HOSPITAL_COMMUNITY): Payer: Self-pay

## 2019-11-15 ENCOUNTER — Ambulatory Visit (HOSPITAL_COMMUNITY)
Admission: EM | Admit: 2019-11-15 | Discharge: 2019-11-15 | Disposition: A | Discharge status: Home / Self Care | Payer: Medicare Other

## 2019-11-15 ENCOUNTER — Other Ambulatory Visit: Payer: Self-pay

## 2019-11-15 DIAGNOSIS — L0291 Cutaneous abscess, unspecified: Secondary | ICD-10-CM
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
## 2019-11-15 NOTE — ED Triage Notes (Signed)
Pt states she has an abscess on her vaginal lip that she noticed last night. Pt states she had one prior in same area that "popped" on it's own.

## 2019-11-15 NOTE — Discharge Instructions (Addendum)
No need for oral antibiotics at this time.  Recommend warm compresses or warm baths to the area.  Avoid shaving You can do some antibiotic ointment to the area Follow up as needed for continued or worsening symptoms

## 2020-05-30 ENCOUNTER — Ambulatory Visit (INDEPENDENT_AMBULATORY_CARE_PROVIDER_SITE_OTHER): Payer: Medicare Other

## 2020-05-30 ENCOUNTER — Encounter (HOSPITAL_COMMUNITY): Payer: Self-pay | Admitting: Emergency Medicine

## 2020-05-30 ENCOUNTER — Other Ambulatory Visit: Payer: Self-pay

## 2020-05-30 ENCOUNTER — Ambulatory Visit (HOSPITAL_COMMUNITY)
Admission: EM | Admit: 2020-05-30 | Discharge: 2020-05-30 | Disposition: A | Discharge status: Home / Self Care | Payer: Medicare Other | Attending: Emergency Medicine | Admitting: Emergency Medicine

## 2020-05-30 DIAGNOSIS — S62626A Displaced fracture of medial phalanx of right little finger, initial encounter for closed fracture: Secondary | ICD-10-CM

## 2020-05-30 DIAGNOSIS — M79644 Pain in right finger(s): Secondary | ICD-10-CM | POA: Diagnosis not present

## 2020-05-30 DIAGNOSIS — M7989 Other specified soft tissue disorders: Secondary | ICD-10-CM | POA: Diagnosis not present

## 2020-05-30 MED ORDER — IBUPROFEN 600 MG PO TABS: 600.0000 mg | ORAL_TABLET | Freq: Four times a day (QID) | ORAL | 0 refills | Status: DC | PRN
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
## 2020-05-30 NOTE — ED Triage Notes (Signed)
Pt presents today with c/o pain/swelling to right fifth finger approx 2 weeks. Denies injury. She is accompanied by her In-Home Aide.

## 2020-05-30 NOTE — Discharge Instructions (Addendum)
Take the ibuprofen as prescribed.  Rest and elevate your hand.  Apply ice packs 2-3 times a day for up to 20 minutes each.  Wear the finger splint.    Follow up with an orthopedic hand specialist listed below.

## 2020-06-12 DIAGNOSIS — E038 Other specified hypothyroidism: Secondary | ICD-10-CM | POA: Insufficient documentation

## 2020-06-12 DIAGNOSIS — Z780 Asymptomatic menopausal state: Secondary | ICD-10-CM | POA: Insufficient documentation

## 2020-11-07 ENCOUNTER — Emergency Department (HOSPITAL_BASED_OUTPATIENT_CLINIC_OR_DEPARTMENT_OTHER): Payer: Medicare Other

## 2020-11-07 ENCOUNTER — Emergency Department (HOSPITAL_BASED_OUTPATIENT_CLINIC_OR_DEPARTMENT_OTHER)
Admission: EM | Admit: 2020-11-07 | Discharge: 2020-11-07 | Disposition: A | Discharge status: Home / Self Care | Payer: Medicare Other | Attending: Emergency Medicine | Admitting: Emergency Medicine

## 2020-11-07 ENCOUNTER — Other Ambulatory Visit: Payer: Self-pay

## 2020-11-07 ENCOUNTER — Encounter (HOSPITAL_BASED_OUTPATIENT_CLINIC_OR_DEPARTMENT_OTHER): Payer: Self-pay

## 2020-11-07 DIAGNOSIS — F17210 Nicotine dependence, cigarettes, uncomplicated: Secondary | ICD-10-CM | POA: Insufficient documentation

## 2020-11-07 DIAGNOSIS — S8251XA Displaced fracture of medial malleolus of right tibia, initial encounter for closed fracture: Secondary | ICD-10-CM | POA: Diagnosis not present

## 2020-11-07 DIAGNOSIS — S99911A Unspecified injury of right ankle, initial encounter: Secondary | ICD-10-CM | POA: Diagnosis present

## 2020-11-07 DIAGNOSIS — W1830XA Fall on same level, unspecified, initial encounter: Secondary | ICD-10-CM | POA: Diagnosis not present

## 2020-11-07 DIAGNOSIS — Y92039 Unspecified place in apartment as the place of occurrence of the external cause: Secondary | ICD-10-CM | POA: Diagnosis not present

## 2020-11-07 DIAGNOSIS — S8254XA Nondisplaced fracture of medial malleolus of right tibia, initial encounter for closed fracture: Secondary | ICD-10-CM

## 2020-11-07 MED ORDER — IBUPROFEN 600 MG PO TABS: 600.0000 mg | ORAL_TABLET | Freq: Four times a day (QID) | ORAL | 0 refills | Status: DC | PRN

## 2020-11-07 MED ORDER — HYDROCODONE-ACETAMINOPHEN 5-325 MG PO TABS: 1.0000 | ORAL_TABLET | Freq: Four times a day (QID) | ORAL | 0 refills | Status: AC | PRN

## 2020-11-07 MED ORDER — HYDROCODONE-ACETAMINOPHEN 5-325 MG PO TABS
2.0000 | ORAL_TABLET | Freq: Once | ORAL | Status: AC
  Administered 2020-11-07: 2 via ORAL
  Filled 2020-11-07: qty 2
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
## 2020-11-07 NOTE — ED Notes (Signed)
Able to walk slowly with CAM boot in place.  Assisted to car with caregivers.

## 2020-11-07 NOTE — ED Triage Notes (Signed)
[facility] report-pt fell from standing today-pain to right ankle-pt wears brace to right LE/baseline-pt agrees to report-NAD-to triage in w/c

## 2020-11-08 ENCOUNTER — Ambulatory Visit: Payer: Self-pay | Admitting: *Deleted

## 2020-11-08 ENCOUNTER — Ambulatory Visit: Payer: Self-pay

## 2020-11-08 NOTE — Telephone Encounter (Signed)
Pt. Reports she fell yesterday and broke her ankle. States pain "medicine is not helping." Instructed she could call her PCP. States "they won't help me." Instructed to call ED, but she may need to be seen again. Verbalizes understanding.

## 2020-11-08 NOTE — Telephone Encounter (Signed)
Answer Assessment - Initial Assessment Questions 1. MECHANISM: "How did the injury happen?" (e.g., twisting injury, direct blow)      Fell 2. ONSET: "When did the injury happen?" (Minutes or hours ago)      Yesterday 3. LOCATION: "Where is the injury located?"      Ankle and foot 4. APPEARANCE of INJURY: "What does the injury look like?"      In a boot 5. WEIGHT-BEARING: "Can you put weight on that foot?" "Can you walk (four steps or more)?"       No 6. SIZE: For cuts, bruises, or swelling, ask: "How large is it?" (e.g., inches or centimeters;  entire joint)      No 7. PAIN: "Is there pain?" If Yes, ask: "How bad is the pain?"    (e.g., Scale 1-10; or mild, moderate, severe)   - NONE (0): no pain.   - MILD (1-3): doesn't interfere with normal activities.    - MODERATE (4-7): interferes with normal activities (e.g., work or school) or awakens from sleep, limping.    - SEVERE (8-10): excruciating pain, unable to do any normal activities, unable to walk.      Severe 8. TETANUS: For any breaks in the skin, ask: "When was the last tetanus booster?"     Unsure 9. OTHER SYMPTOMS: "Do you have any other symptoms?"      Pain 10. PREGNANCY: "Is there any chance you are pregnant?" "When was your last menstrual period?"       No  Protocols used: Ankle and Foot Injury-A-AH

## 2020-11-08 NOTE — Telephone Encounter (Signed)
Pt called in on the community line per agent.   The line got disconnected when agent transferred call to me.  She was calling in something to do with pain in her toe.   (Agent said she had a very hard time understanding the pt so wasn't sure exactly what the problem was.).   She was seen in the ED yesterday and has a non displaced ankle fracture per the chart.    I tried calling her back twice however got that the line was not in service.  [phone].

## 2020-11-09 DIAGNOSIS — S8254XD Nondisplaced fracture of medial malleolus of right tibia, subsequent encounter for closed fracture with routine healing: Secondary | ICD-10-CM | POA: Insufficient documentation

## 2021-03-03 ENCOUNTER — Other Ambulatory Visit: Payer: Self-pay

## 2021-03-03 ENCOUNTER — Emergency Department (HOSPITAL_COMMUNITY): Payer: Medicare Other

## 2021-03-03 ENCOUNTER — Emergency Department (HOSPITAL_COMMUNITY)
Admission: EM | Admit: 2021-03-03 | Discharge: 2021-03-03 | Disposition: A | Discharge status: Home / Self Care | Payer: Medicare Other | Attending: Student | Admitting: Student

## 2021-03-03 ENCOUNTER — Encounter (HOSPITAL_COMMUNITY): Payer: Self-pay | Admitting: Emergency Medicine

## 2021-03-03 DIAGNOSIS — R200 Anesthesia of skin: Secondary | ICD-10-CM | POA: Insufficient documentation

## 2021-03-03 DIAGNOSIS — Z5321 Procedure and treatment not carried out due to patient leaving prior to being seen by health care provider: Secondary | ICD-10-CM | POA: Diagnosis not present

## 2021-03-03 DIAGNOSIS — R4781 Slurred speech: Secondary | ICD-10-CM | POA: Diagnosis not present

## 2021-03-03 LAB — CBC WITH DIFFERENTIAL/PLATELET
Band Neutrophils: 0 %
Basophils Relative: 2 %
Blasts: NONE SEEN %
Eosinophils Relative: 1 %
HCT: 44.1 % (ref 36.0–46.0)
Hemoglobin: 15.2 g/dL — ABNORMAL HIGH (ref 12.0–15.0)
Lymphocytes Relative: 42 %
MCH: 32.5 pg (ref 26.0–34.0)
MCHC: 34.5 g/dL (ref 30.0–36.0)
MCV: 94.2 fL (ref 80.0–100.0)
Metamyelocytes Relative: NONE SEEN %
Monocytes Relative: 6 %
Myelocytes: NONE SEEN %
Neutrophils Relative %: 49 %
Platelets: 399 10*3/uL (ref 150–400)
Promyelocytes Relative: NONE SEEN %
RBC Morphology: NORMAL
RBC: 4.68 MIL/uL (ref 3.87–5.11)
RDW: 13.1 % (ref 11.5–15.5)
WBC Morphology: NORMAL
WBC: 14.6 10*3/uL — ABNORMAL HIGH (ref 4.0–10.5)
nRBC: 0 % (ref 0.0–0.2)
nRBC: 1 /100 WBC — ABNORMAL HIGH

## 2021-03-03 LAB — BASIC METABOLIC PANEL
Anion gap: 6 (ref 5–15)
BUN: 14 mg/dL (ref 6–20)
CO2: 26 mmol/L (ref 22–32)
Calcium: 8.9 mg/dL (ref 8.9–10.3)
Chloride: 105 mmol/L (ref 98–111)
Creatinine, Ser: 0.53 mg/dL (ref 0.44–1.00)
GFR, Estimated: >60 mL/min (ref >60)
Glucose, Bld: 90 mg/dL (ref 70–99)
Potassium: 3.7 mmol/L (ref 3.5–5.1)
Sodium: 137 mmol/L (ref 135–145)

## 2021-03-03 NOTE — ED Triage Notes (Signed)
Patient here from home reporting left arm pain and numbness. States "this is my good arm". Able to move and lift arm. Reports hx of TBI with speech impairment per baseline.

## 2021-03-03 NOTE — ED Provider Notes (Signed)
Emergency Medicine Provider Triage Evaluation Note  Santana Gosdin , a 50 y.o. female  was evaluated in triage.  Pt complains of left arm numbness.  Started a week ago, it tends to be intermittent and its not painful.  It is distributed from her shoulder all the way to the tips of her fingertip.  She is status post prior TBI, slurred speech at baseline.  Denies any chest pain or shortness of breath..  Review of Systems  Positive: Left arm numbness Negative: Chest pain  Physical Exam  BP 107/66 (BP Location: Left Arm)   Pulse (!) 109   Temp 98.4 F (36.9 C) (Oral)   Resp 16   LMP 08/30/2019 (Approximate)   SpO2 95%  Gen:   Awake, no distress   Resp:  Normal effort  MSK:   Moves extremities without difficulty  Other:  Left arm numbness, slurred speech at baseline.  Slightly asymmetric face, appears to be baseline   Medical Decision Making  Medically screening exam initiated at 6:30 PM.  Appropriate orders placed.  Florentina Marquart was informed that the remainder of the evaluation will be completed by another provider, this initial triage assessment does not replace that evaluation, and the importance of remaining in the ED until their evaluation is complete.  Staffed with an attending Dr. Lavenia Atlas.  Decided to order MRI and basic lab work   Sherrill Raring, PA-C 03/03/21 [place], [place], DO 03/03/21 2245

## 2021-03-03 NOTE — ED Notes (Signed)
Patient was called to obtain vital signs but no response.

## 2021-03-03 NOTE — ED Notes (Signed)
Patient transported to MRI 

## 2021-08-08 ENCOUNTER — Other Ambulatory Visit: Payer: Self-pay

## 2021-08-08 DIAGNOSIS — Y92009 Unspecified place in unspecified non-institutional (private) residence as the place of occurrence of the external cause: Secondary | ICD-10-CM | POA: Insufficient documentation

## 2021-08-08 DIAGNOSIS — S52612A Displaced fracture of left ulna styloid process, initial encounter for closed fracture: Secondary | ICD-10-CM | POA: Insufficient documentation

## 2021-08-08 DIAGNOSIS — S52572A Other intraarticular fracture of lower end of left radius, initial encounter for closed fracture: Secondary | ICD-10-CM | POA: Diagnosis not present

## 2021-08-08 DIAGNOSIS — W1839XA Other fall on same level, initial encounter: Secondary | ICD-10-CM | POA: Diagnosis not present

## 2021-08-08 DIAGNOSIS — S6992XA Unspecified injury of left wrist, hand and finger(s), initial encounter: Secondary | ICD-10-CM | POA: Diagnosis present

## 2021-08-08 DIAGNOSIS — M79632 Pain in left forearm: Secondary | ICD-10-CM | POA: Diagnosis not present

## 2021-08-09 ENCOUNTER — Emergency Department (HOSPITAL_BASED_OUTPATIENT_CLINIC_OR_DEPARTMENT_OTHER): Payer: Medicare Other | Admitting: Radiology

## 2021-08-09 ENCOUNTER — Encounter (HOSPITAL_BASED_OUTPATIENT_CLINIC_OR_DEPARTMENT_OTHER): Payer: Self-pay | Admitting: Emergency Medicine

## 2021-08-09 ENCOUNTER — Emergency Department (HOSPITAL_BASED_OUTPATIENT_CLINIC_OR_DEPARTMENT_OTHER)
Admission: EM | Admit: 2021-08-09 | Discharge: 2021-08-09 | Disposition: A | Discharge status: Home / Self Care | Payer: Medicare Other | Attending: Emergency Medicine | Admitting: Emergency Medicine

## 2021-08-09 DIAGNOSIS — S52572A Other intraarticular fracture of lower end of left radius, initial encounter for closed fracture: Secondary | ICD-10-CM | POA: Diagnosis not present

## 2021-08-09 DIAGNOSIS — S52612G Displaced fracture of left ulna styloid process, subsequent encounter for closed fracture with delayed healing: Secondary | ICD-10-CM

## 2021-08-09 DIAGNOSIS — S52502A Unspecified fracture of the lower end of left radius, initial encounter for closed fracture: Secondary | ICD-10-CM

## 2021-08-09 DIAGNOSIS — W19XXXA Unspecified fall, initial encounter: Secondary | ICD-10-CM

## 2021-08-09 MED ORDER — OXYCODONE-ACETAMINOPHEN 5-325 MG PO TABS: 2.0000 | ORAL_TABLET | ORAL | 0 refills | Status: AC | PRN

## 2021-08-09 MED ORDER — OXYCODONE-ACETAMINOPHEN 5-325 MG PO TABS
1.0000 | ORAL_TABLET | ORAL | Status: DC | PRN
  Administered 2021-08-09: 1 via ORAL
  Filled 2021-08-09: qty 1

## 2021-08-09 NOTE — ED Notes (Signed)
Sling/splint applied following approval for discharge by EDP [place]  ?

## 2021-08-09 NOTE — ED Notes (Addendum)
Returned to lobby from xray ?

## 2021-08-09 NOTE — ED Notes (Signed)
Transported to xray 

## 2021-08-09 NOTE — ED Notes (Signed)
Pt pending discharge following completion of Splint/Sling. ?

## 2021-08-09 NOTE — ED Provider Notes (Signed)
?[facility] ?Provider Note ? ? ?CSN: [id number] ?Arrival date & time: 08/08/21  2356 ? ?  ? ?History ? ?Chief Complaint  ?Patient presents with  ? Fall  ? Arm Injury  ? ? ?Angela Combs is a 51 y.o. female. ? ?Patient with history of traumatic brain injury, seizures, anxiety, GERD.  Patient presenting with complaints of fall.  Patient states he was cooking in the kitchen when she slipped and fell, causing injury to her left wrist.  This was placed in a splint, then patient brought here by EMS.  She denies other injury.  Pain is worse with movement and palpation.  There are no alleviating factors. ? ?The history is provided by the patient.  ? ?  ? ?Home Medications ?Prior to Admission medications   ?Medication Sig Start Date End Date Taking? Authorizing Provider  ?cetirizine (ZYRTEC) 10 MG tablet Take 10 mg by mouth daily.    [provider]  ?docusate sodium (COLACE) 100 MG capsule Take 200 mg by mouth at bedtime.     [provider]  ?doxepin (SINEQUAN) 50 MG capsule Take by mouth. 01/05/18   [provider]  ?Eszopiclone 3 MG TABS TAKE ONE TABLET BY MOUTH AT BEDTIME 01/18/18   [provider]  ?HYDROcodone-acetaminophen (NORCO/VICODIN) 5-325 MG tablet Take 1 tablet by mouth every 6 (six) hours as needed. 11/07/20   Quintella Reichert, MD  ?ibuprofen (ADVIL) 600 MG tablet Take 1 tablet (600 mg total) by mouth every 6 (six) hours as needed. 11/07/20   Quintella Reichert, MD  ?Multiple Vitamin (MULTIVITAMIN) tablet Take 1 tablet by mouth daily.    [provider]  ?   ? ?Allergies    ?Gabapentin   ? ?Review of Systems   ?Review of Systems  ?All other systems reviewed and are negative. ? ?Physical Exam ?Updated Vital Signs ?BP 110/77   Pulse 64   Temp 98.5 ?F (36.9 ?C) (Oral)   Resp 16   Wt 76.2 kg   LMP 08/30/2019 (Approximate)   SpO2 97%   BMI 28.84 kg/m?  ?Physical Exam ?Vitals and nursing note reviewed.  ?Constitutional:   ?   Appearance:  Normal appearance.  ?HENT:  ?   Head: Normocephalic and atraumatic.  ?Pulmonary:  ?   Effort: Pulmonary effort is normal.  ?Musculoskeletal:  ?   Comments: There is swelling and mild deformity at the left wrist.  Capillary refill is brisk and motor and sensation are intact throughout the entire hand.  ?Skin: ?   General: Skin is warm and dry.  ?Neurological:  ?   Mental Status: She is alert and oriented to person, place, and time.  ? ? ?ED Results / Procedures / Treatments   ?Labs ?(all labs ordered are listed, but only abnormal results are displayed) ?Labs Reviewed - No data to display ? ?EKG ?None ? ?Radiology ?DG Wrist Complete Left ? ?Result Date: 08/09/2021 ?CLINICAL DATA:  Fall with wrist pain EXAM: LEFT WRIST - COMPLETE 3+ VIEW COMPARISON:  None. FINDINGS: Acute mildly displaced ulnar styloid process fracture. Acute comminuted and impacted intra-articular distal radius fracture with less than 1/4 shaft diameter dorsal displacement of distal fracture fragment. No subluxation. Positive for soft tissue swelling IMPRESSION: 1. Acute comminuted mildly displaced intra-articular distal radius fracture 2. Acute mildly displaced ulnar styloid process fracture Electronically Signed   By: Donavan Foil M.D.   On: 08/09/2021 00:40   ? ?Procedures ?Procedures  ? ? ?Medications Ordered in ED ?Medications  ?oxyCODONE-acetaminophen (PERCOCET/ROXICET)  5-325 MG per tablet 1 tablet (1 tablet Oral Given 08/09/21 0011)  ? ? ?ED Course/ Medical Decision Making/ A&P ? ?Patient presenting after a fall on an outstretched hand.  She has a comminuted distal radius and ulnar styloid fractures.  Will be placed in a sugar-tong splint and sling.  Patient to be followed up in the orthopedic office.  Pain treated here with Percocet and will be discharged with the same. ? ?Final Clinical Impression(s) / ED Diagnoses ?Final diagnoses:  ?None  ? ? ?Rx / DC Orders ?ED Discharge Orders   ? ? None  ? ?  ? ? ?  ?Veryl Speak, MD ?08/09/21 0251 ? ?

## 2021-08-09 NOTE — ED Triage Notes (Signed)
Presents from home for mechanical fall onto L wrist. EMS notes swelling and tenderness without obvious deformity, SAM splint applied PTA. EMS vital 116/78,80,18,100%. ? ?Lives with a roommate at home. Speech impediment is baseline.  ?

## 2021-08-09 NOTE — Discharge Instructions (Signed)
Wear sling as applied until followed up by orthopedics. ? ?Ice for 20 minutes every 2 hours while awake for the next 2 days. ? ?Take Percocet as prescribed as needed for pain. ? ?Follow-up with hand surgery in the next 1 to 2 days.  The contact information for Dr. Greta Doom has been provided in this discharge summary for you to call this morning and make these arrangements. ?

## 2021-08-09 NOTE — ED Notes (Signed)
Discharge instructions including prescription, pain management, and follow up care discussed with pt. Discharge instructions were also reviewed by two caregivers at bedside. Pt went home with caregivers. Was assisted to care via wheelchair. Pt was able to stand, walk a few steps with assistance, and get into car.  ?

## 2021-08-22 ENCOUNTER — Ambulatory Visit: Payer: Self-pay | Admitting: *Deleted

## 2021-08-22 NOTE — Telephone Encounter (Signed)
?  Chief Complaint: left  wrist pain after fall and breaking wrist, requesting pain medicaiton ?Symptoms: left wrist with cast intact since Wednesday last week. Pain shooting from under cast fingers swollen. ?Frequency: na  ?Pertinent Negatives: Patient denies na  ?Disposition: '[x]'$ ED /'[]'$ Urgent Care (no appt availability in office) / '[]'$ Appointment(In office/virtual)/ '[]'$  [facility]/ '[]'$ Home Care/ '[]'$ Refused Recommended Disposition /'[]'$ [facility]/ '[]'$  Follow-up with PCP ?Additional Notes:  ? ?Recommended patient contact PCP  for pain medication and go to ED for evaluation due to shooting pain from under cast ? ? Reason for Disposition ? [1] SEVERE pain AND [2] not improved 2 hours after pain medicine/ice packs ? ?Answer Assessment - Initial Assessment Questions ?1. MECHANISM: "How did the injury happen?"  ?    Fell on floor and cast on since last Wednesday  ?2. WHEN: "When did the injury happen?" (Minutes or hours ago)  ?    Last Wednesday  ?3. LOCATION: "Which wrist or hand is injured?" ?    fell ?4. APPEARANCE of INJURY: "What does the injury look like?"  ?    Cast on left wrist  ?5. SEVERITY: "Can your child move the wrist or hand normally?" For wrist, can rotate palm up and down and move the hand up and down (wrist flexion/extension). For hand, can make a fist and open it straight. ?    Na ?6. SIZE: For bruises or swelling, ask: "How large is it?" (Inches or centimeters)  ?    Fingers swollen  ?7. PAIN: "Is there pain?" If so, ask: "How bad is the pain?"  ?    Pain shooting up arm from under cast  ?8. TETANUS: For any breaks in the skin, ask: "When was the last tetanus booster?" ?    na ? ?Protocols used: Wrist or Hand Injury-P-AH ? ?

## 2022-01-15 DIAGNOSIS — E063 Autoimmune thyroiditis: Secondary | ICD-10-CM | POA: Insufficient documentation

## 2022-07-01 DIAGNOSIS — E8881 Metabolic syndrome: Secondary | ICD-10-CM | POA: Insufficient documentation

## 2022-07-01 DIAGNOSIS — E6609 Other obesity due to excess calories: Secondary | ICD-10-CM | POA: Insufficient documentation

## 2022-07-01 DIAGNOSIS — Z8619 Personal history of other infectious and parasitic diseases: Secondary | ICD-10-CM | POA: Insufficient documentation

## 2022-08-11 DIAGNOSIS — F5104 Psychophysiologic insomnia: Secondary | ICD-10-CM | POA: Insufficient documentation

## 2022-11-13 DIAGNOSIS — E782 Mixed hyperlipidemia: Secondary | ICD-10-CM | POA: Insufficient documentation

## 2022-12-09 ENCOUNTER — Encounter (HOSPITAL_COMMUNITY): Payer: Self-pay | Admitting: Emergency Medicine

## 2022-12-09 ENCOUNTER — Ambulatory Visit
Admission: EM | Admit: 2022-12-09 | Discharge: 2022-12-09 | Disposition: A | Discharge status: Home / Self Care | Payer: 59 | Attending: Physician Assistant | Admitting: Physician Assistant

## 2022-12-09 ENCOUNTER — Other Ambulatory Visit: Payer: Self-pay

## 2022-12-09 ENCOUNTER — Ambulatory Visit (INDEPENDENT_AMBULATORY_CARE_PROVIDER_SITE_OTHER): Payer: 59

## 2022-12-09 ENCOUNTER — Emergency Department (HOSPITAL_COMMUNITY)
Admission: EM | Admit: 2022-12-09 | Discharge: 2022-12-09 | Disposition: A | Discharge status: Home / Self Care | Payer: 59 | Attending: Emergency Medicine | Admitting: Emergency Medicine

## 2022-12-09 DIAGNOSIS — W010XXA Fall on same level from slipping, tripping and stumbling without subsequent striking against object, initial encounter: Secondary | ICD-10-CM | POA: Diagnosis not present

## 2022-12-09 DIAGNOSIS — Z87820 Personal history of traumatic brain injury: Secondary | ICD-10-CM

## 2022-12-09 DIAGNOSIS — S40011A Contusion of right shoulder, initial encounter: Secondary | ICD-10-CM | POA: Diagnosis not present

## 2022-12-09 DIAGNOSIS — W19XXXA Unspecified fall, initial encounter: Secondary | ICD-10-CM | POA: Diagnosis not present

## 2022-12-09 DIAGNOSIS — S42001A Fracture of unspecified part of right clavicle, initial encounter for closed fracture: Secondary | ICD-10-CM | POA: Insufficient documentation

## 2022-12-09 DIAGNOSIS — S4991XA Unspecified injury of right shoulder and upper arm, initial encounter: Secondary | ICD-10-CM | POA: Diagnosis present

## 2022-12-09 DIAGNOSIS — S0990XA Unspecified injury of head, initial encounter: Secondary | ICD-10-CM | POA: Diagnosis not present

## 2022-12-09 DIAGNOSIS — S42031A Displaced fracture of lateral end of right clavicle, initial encounter for closed fracture: Secondary | ICD-10-CM | POA: Diagnosis not present

## 2022-12-09 DIAGNOSIS — M25511 Pain in right shoulder: Secondary | ICD-10-CM | POA: Insufficient documentation

## 2022-12-09 MED ORDER — ACETAMINOPHEN 500 MG PO TABS
1000.0000 mg | ORAL_TABLET | Freq: Once | ORAL | Status: AC
  Administered 2022-12-09: 1000 mg via ORAL
  Filled 2022-12-09: qty 2

## 2022-12-09 MED ORDER — TRAMADOL HCL 50 MG PO TABS
50.0000 mg | ORAL_TABLET | Freq: Once | ORAL | Status: AC
  Administered 2022-12-09: 50 mg via ORAL
  Filled 2022-12-09: qty 1

## 2022-12-09 MED ORDER — IBUPROFEN 800 MG PO TABS: 800.0000 mg | ORAL_TABLET | Freq: Three times a day (TID) | ORAL | 0 refills | Status: AC
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
## 2022-12-09 NOTE — ED Triage Notes (Signed)
"  I fell yesterday". "Floor was wet, stood up to go to bathroom and fell into tub". I fell onto "my right side". Currently having  Right shoulder/arm pain. No right hip pain. No head injury. No laceration. No loc.

## 2022-12-09 NOTE — ED Provider Notes (Signed)
[facility] AT [facility] Provider Note   CSN: [id number] Arrival date & time: 12/09/22  1950     History  Chief Complaint  Patient presents with   Fall   Shoulder Pain    Angela Combs is a 52 y.o. female.  Patient with c/o fall last evening, trip and fall, mechanical fall. No loc. C/o pain to right shoulder. No anticoagulant use. No headache. No neck or back pain. No sob. No other extremity pain or injury. Skin intact. Was seen at urgent care and sent to ED. No meds pta.   The history is provided by the patient, a caregiver and medical records.  Fall Pertinent negatives include no chest pain, no abdominal pain, no headaches and no shortness of breath.  Shoulder Pain Associated symptoms: no fever        Home Medications Prior to Admission medications   Medication Sig Start Date End Date Taking? Authorizing Provider  cetirizine (ZYRTEC) 10 MG tablet Take 10 mg by mouth daily.    [provider]  docusate sodium (COLACE) 100 MG capsule Take 200 mg by mouth at bedtime.     [provider]  doxepin (SINEQUAN) 50 MG capsule Take by mouth. 01/05/18   [provider]  Eszopiclone 3 MG TABS TAKE ONE TABLET BY MOUTH AT BEDTIME 01/18/18   [provider]  HYDROcodone-acetaminophen (NORCO/VICODIN) 5-325 MG tablet Take 1 tablet by mouth every 6 (six) hours as needed. 11/07/20   Tilden Fossa, MD  ibuprofen (ADVIL) 800 MG tablet Take 1 tablet (800 mg total) by mouth 3 (three) times daily. 12/09/22   Tomi Bamberger, PA-C  Multiple Vitamin (MULTIVITAMIN) tablet Take 1 tablet by mouth daily.    [provider]  oxyCODONE-acetaminophen (PERCOCET) 5-325 MG tablet Take 2 tablets by mouth every 4 (four) hours as needed. 08/09/21   Geoffery Lyons, MD      Allergies    Gabapentin    Review of Systems   Review of Systems  Constitutional:  Negative for fever.  Respiratory:  Negative for shortness of breath.    Cardiovascular:  Negative for chest pain.  Gastrointestinal:  Negative for abdominal pain.  Musculoskeletal:        Right shoulder injury.   Skin:  Negative for wound.  Neurological:  Negative for weakness, numbness and headaches.    Physical Exam Updated Vital Signs BP 126/85   Pulse 87   Temp 98.6 F (37 C) (Oral)   Resp 18   Ht 1.626 m (5\' 4" )   Wt 90.7 kg   LMP 08/30/2019 (Approximate)   SpO2 99%   BMI 34.32 kg/m  Physical Exam Vitals and nursing note reviewed.  Constitutional:      Appearance: Normal appearance. She is well-developed.  HENT:     Head: Atraumatic.     Nose: Nose normal.     Mouth/Throat:     Mouth: Mucous membranes are moist.  Eyes:     General: No scleral icterus.    Conjunctiva/sclera: Conjunctivae normal.     Pupils: Pupils are equal, round, and reactive to light.  Neck:     Trachea: No tracheal deviation.  Cardiovascular:     Rate and Rhythm: Normal rate and regular rhythm.     Pulses: Normal pulses.     Heart sounds: Normal heart sounds. No murmur heard.    No friction rub. No gallop.  Pulmonary:     Effort: Pulmonary effort is normal. No respiratory distress.  Breath sounds: Normal breath sounds.  Chest:     Chest wall: No tenderness.  Abdominal:     General: There is no distension.     Palpations: Abdomen is soft.     Tenderness: There is no abdominal tenderness.  Musculoskeletal:     Cervical back: Normal range of motion and neck supple. No rigidity or tenderness. No muscular tenderness.     Comments: Mild swelling and tenderness right shoulder/distal clavicle area. Skin intact. No other pain or focal bony tenderness on extremity exam. CTLS spine, non tender, aligned, no step off.  Rue swelling, no pain/tenderness at elbow or wrist. Radial pulse palp.   Skin:    General: Skin is warm and dry.     Findings: No rash.  Neurological:     Mental Status: She is alert.     Comments: Alert, speech normal. RUE nvi w intact motor/sens  fxn.   Psychiatric:        Mood and Affect: Mood normal.     ED Results / Procedures / Treatments   Labs (all labs ordered are listed, but only abnormal results are displayed) Labs Reviewed - No data to display  EKG None  Radiology DG Shoulder Right  Result Date: 12/09/2022 CLINICAL DATA:  Fall EXAM: RIGHT SHOULDER - 2+ VIEW COMPARISON:  None Available. FINDINGS: There is an acute inferiorly displaced fracture of the distal clavicle. Acromioclavicular alignment appears maintained. Glenohumeral alignment is maintained. There is no other acute fracture. The joint spaces appear preserved. There is no erosive change. The soft tissues are unremarkable. IMPRESSION: Acute inferiorly displaced fracture of the distal clavicle. Electronically Signed   By: Lesia Hausen M.D.   On: 12/09/2022 15:10    Procedures Procedures    Medications Ordered in ED Medications  acetaminophen (TYLENOL) tablet 1,000 mg (has no administration in time range)  traMADol (ULTRAM) tablet 50 mg (has no administration in time range)    ED Course/ Medical Decision Making/ A&P                                 Medical Decision Making Problems Addressed: Closed displaced fracture of right clavicle, unspecified part of clavicle, initial encounter: acute illness or injury with systemic symptoms that poses a threat to life or bodily functions Fall from slip, trip, or stumble, initial encounter: acute illness or injury with systemic symptoms that poses a threat to life or bodily functions History of traumatic brain injury: chronic illness or injury that poses a threat to life or bodily functions  Amount and/or Complexity of Data Reviewed Independent Historian: caregiver    Details: hx External Data Reviewed: notes. Radiology: independent interpretation performed. Decision-making details documented in ED Course.  Risk OTC drugs. Prescription drug management.   Imaging reviewed/interpreted by me - distal right  clavicle fx.   Reviewed nursing notes and prior charts for additional history.   Additional hx from caregiver.   No meds pta. Ultram po, acetaminophen po. Icepack.   Shoulder immobilizer.   Pain controlled.   Rec ortho f/u.          Final Clinical Impression(s) / ED Diagnoses Final diagnoses:  None    Rx / DC Orders ED Discharge Orders     None         Cathren Laine, MD 12/09/22 2308

## 2022-12-09 NOTE — Progress Notes (Signed)
Orthopedic Tech Progress Note Patient Details:  Angela Combs Jul 15, 1970 [id number]  Ortho Devices Type of Ortho Device: Shoulder immobilizer Ortho Device/Splint Location: RUE Ortho Device/Splint Interventions: Ordered, Application, Adjustment   Post Interventions Patient Tolerated: Well  Al Decant 12/09/2022, 11:33 PM

## 2022-12-09 NOTE — Discharge Instructions (Signed)
It was our pleasure to provide your ER care today - we hope that you feel better.  Wear shoulder sling/immobilizer for comfort/support. Icepack to sore area.   Take acetaminophen or ibuprofen as need.   Follow up with orthopedist in the coming week - call office to arrange appointment.   Return to ER if worse, new symptoms, new/severe pain, or other concern.

## 2022-12-09 NOTE — ED Triage Notes (Signed)
Patient c/o fall yesterday morning and injuring her right shoulder/collar bone.  Patient has bruising to right shoulder.  Patient had imaging done at [facility] and was told her collar bone was fractured and displaced.

## 2023-09-09 IMAGING — DX DG WRIST COMPLETE 3+V*L*
4 series · 4 of 4 positions shown · non-contrast
Comparison: None.

CLINICAL DATA: Fall with wrist pain

EXAM:
LEFT WRIST - COMPLETE 3+ VIEW

[wrist ap]
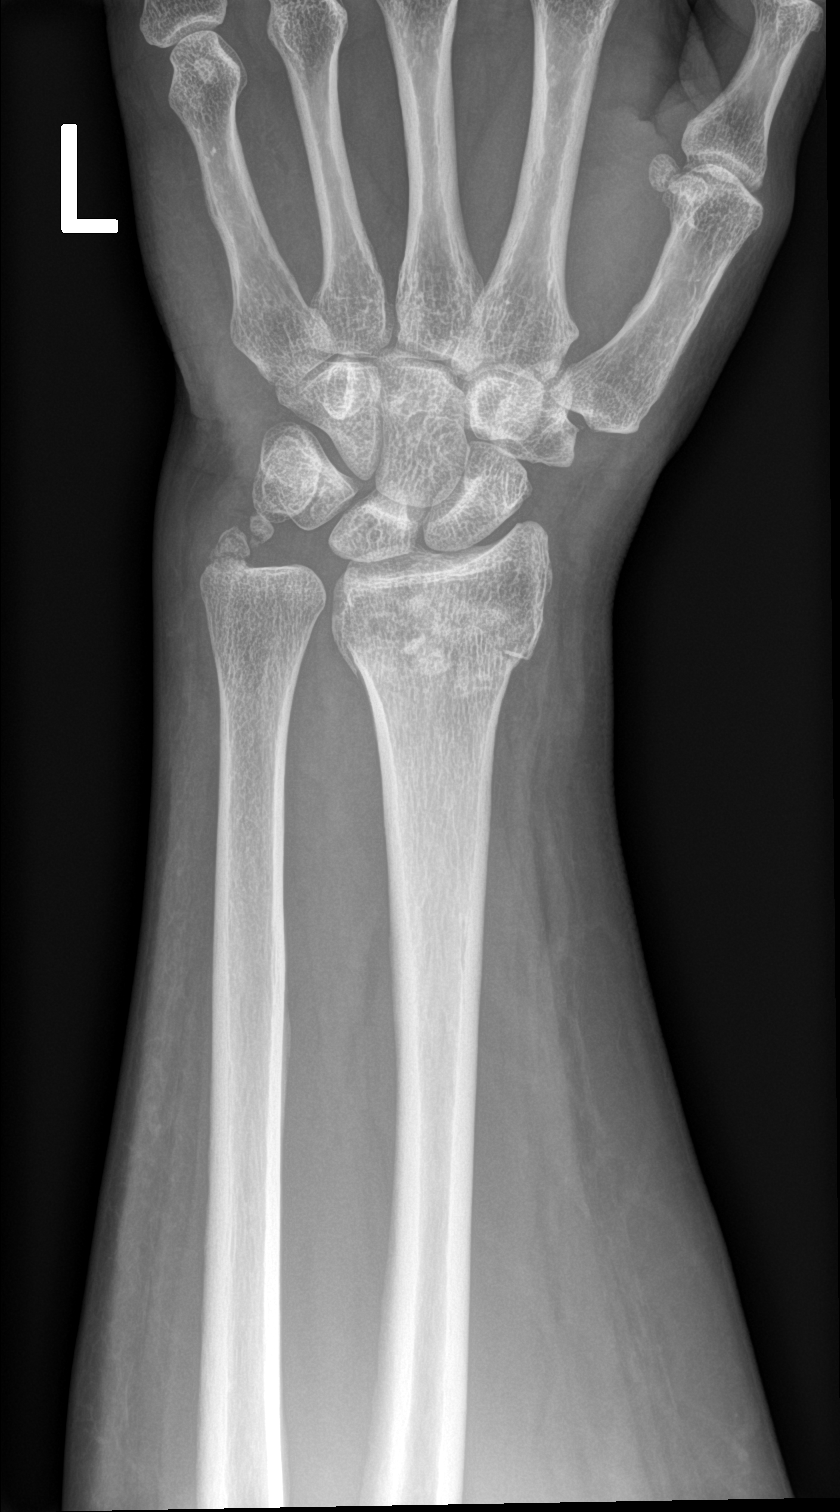

[wrist obl]
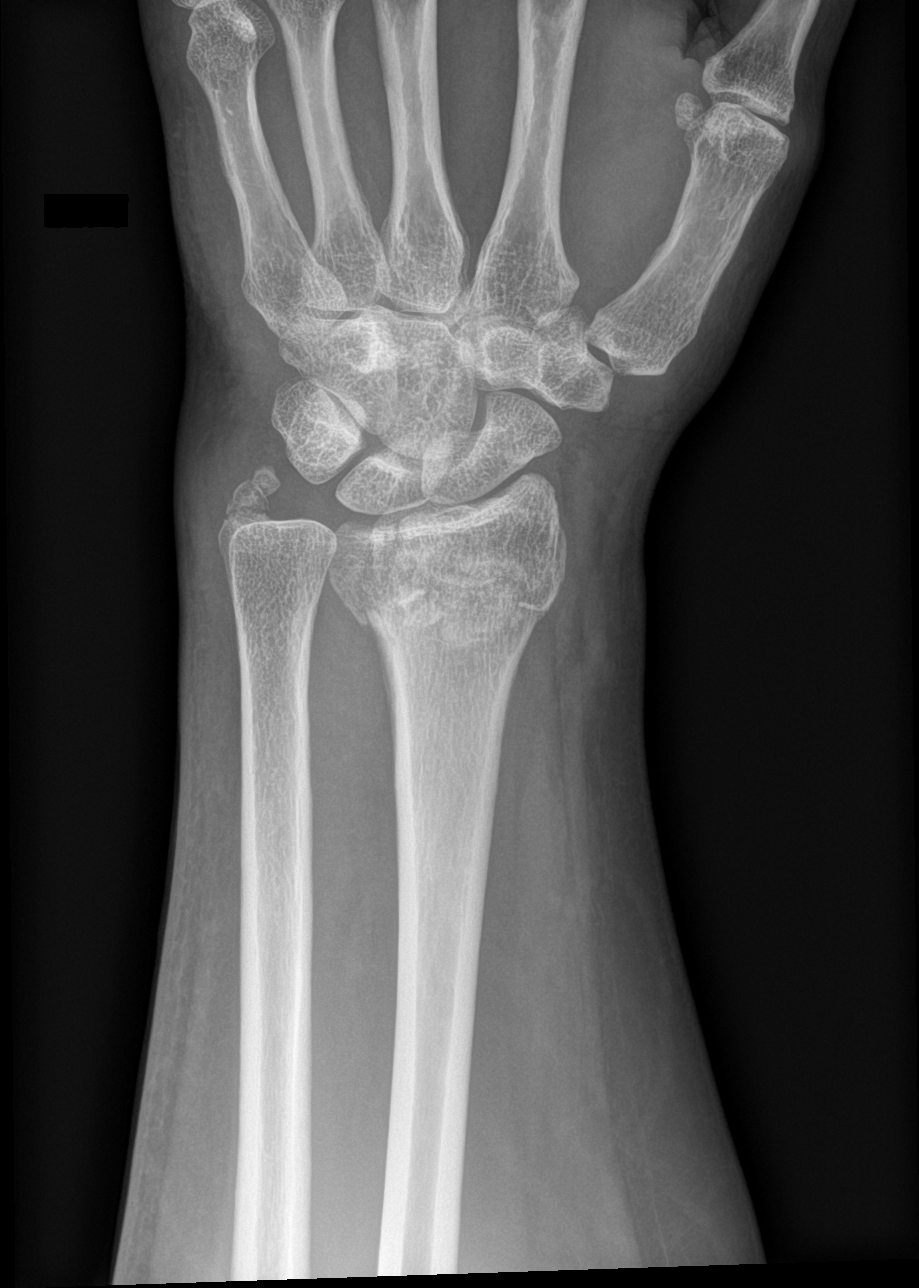

[wrist lat]
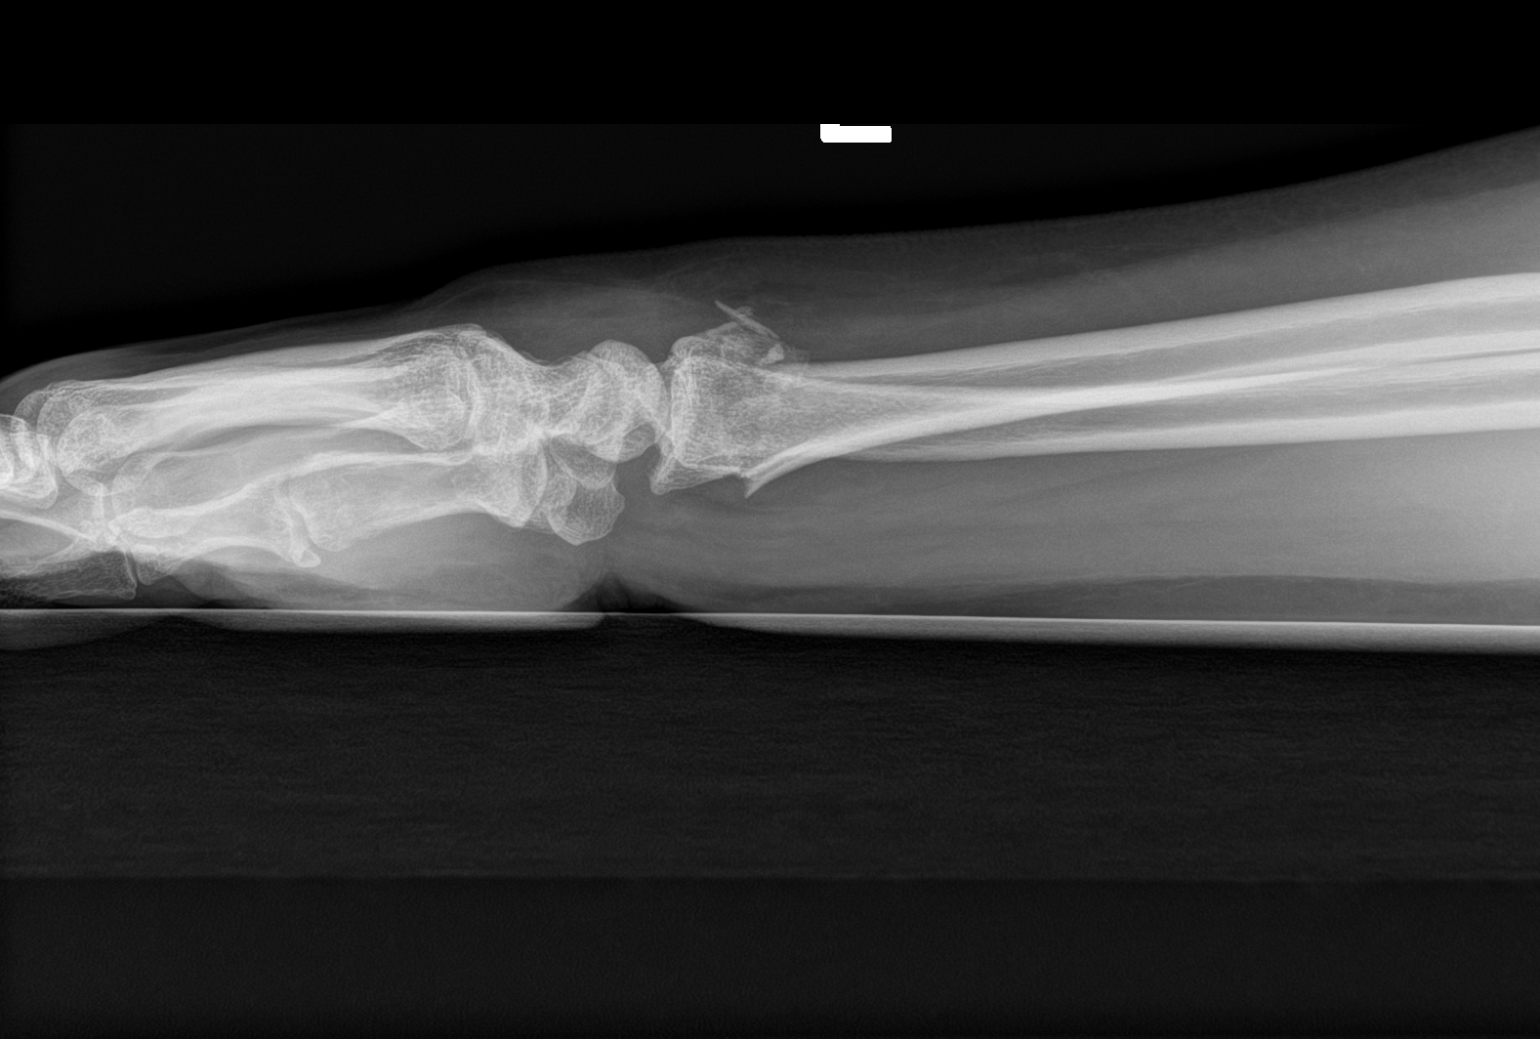

[wrist navicular]
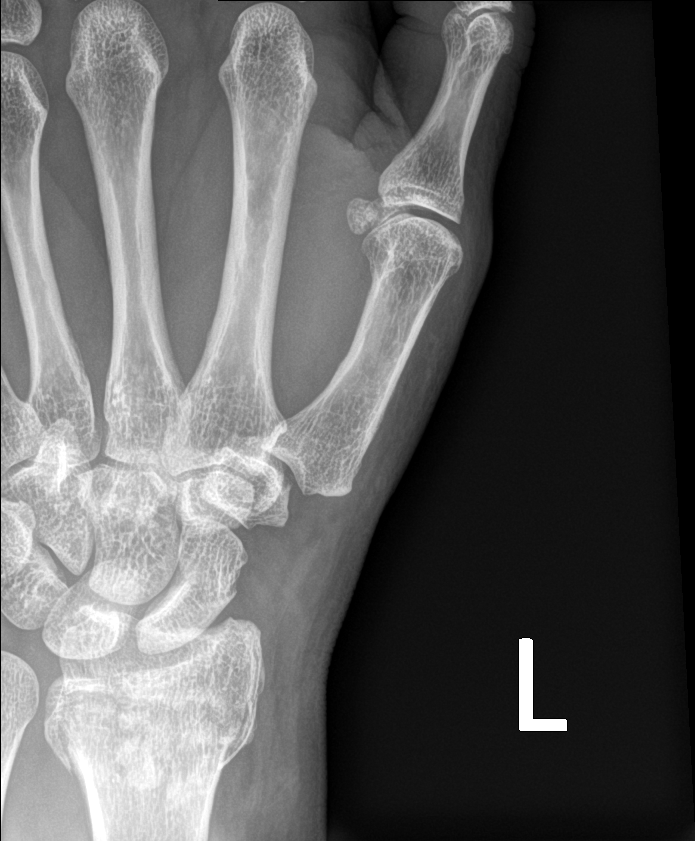

[4 of 4 positions shown; findings below may reference images not displayed]

FINDINGS: Acute mildly displaced ulnar styloid process fracture. Acute
comminuted and impacted intra-articular distal radius fracture with
less than [DATE] shaft diameter dorsal displacement of distal fracture
fragment. No subluxation. Positive for soft tissue swelling
IMPRESSION: 1. Acute comminuted mildly displaced intra-articular distal radius
fracture
2. Acute mildly displaced ulnar styloid process fracture

## 2023-12-29 ENCOUNTER — Other Ambulatory Visit (HOSPITAL_BASED_OUTPATIENT_CLINIC_OR_DEPARTMENT_OTHER): Payer: Self-pay
